# Patient Record
Sex: Female | Born: 1940 | Race: Black or African American | Hispanic: No | Marital: Single | Smoking: Current every day smoker
Health system: Southern US, Community
[De-identification: ages and names within clinical notes are randomized; demographics above are authoritative.]

## PROBLEM LIST (undated history)

## (undated) ENCOUNTER — Emergency Department (HOSPITAL_COMMUNITY): Admission: EM | Discharge status: Against Medical Advice | Payer: Medicare HMO

## (undated) DIAGNOSIS — I10 Essential (primary) hypertension: Secondary | ICD-10-CM

## (undated) DIAGNOSIS — N183 Chronic kidney disease, stage 3 unspecified: Secondary | ICD-10-CM

## (undated) DIAGNOSIS — M5430 Sciatica, unspecified side: Secondary | ICD-10-CM

## (undated) DIAGNOSIS — I2510 Atherosclerotic heart disease of native coronary artery without angina pectoris: Secondary | ICD-10-CM

## (undated) HISTORY — PX: ABDOMINAL SURGERY: SHX537

## (undated) HISTORY — PX: CORONARY ANGIOPLASTY WITH STENT PLACEMENT: SHX49

## (undated) HISTORY — PX: TONSILLECTOMY AND ADENOIDECTOMY: SHX28

---

## 2005-03-20 ENCOUNTER — Encounter: Admission: RE | Admit: 2005-03-20 | Discharge: 2005-03-20 | Payer: Self-pay

## 2006-07-11 ENCOUNTER — Emergency Department (HOSPITAL_COMMUNITY): Admission: EM | Admit: 2006-07-11 | Discharge: 2006-07-12 | Payer: Self-pay | Admitting: Emergency Medicine

## 2009-05-03 ENCOUNTER — Inpatient Hospital Stay (HOSPITAL_COMMUNITY): Admission: EM | Admit: 2009-05-03 | Discharge: 2009-05-06 | Payer: Self-pay | Admitting: Emergency Medicine

## 2009-05-04 ENCOUNTER — Encounter (INDEPENDENT_AMBULATORY_CARE_PROVIDER_SITE_OTHER): Payer: Self-pay | Admitting: Cardiovascular Disease

## 2009-05-04 ENCOUNTER — Ambulatory Visit: Payer: Self-pay | Admitting: Surgery

## 2009-05-19 ENCOUNTER — Encounter (HOSPITAL_COMMUNITY): Admission: RE | Admit: 2009-05-19 | Discharge: 2009-08-17 | Payer: Self-pay | Admitting: Cardiovascular Disease

## 2009-05-25 ENCOUNTER — Ambulatory Visit (HOSPITAL_COMMUNITY): Admission: RE | Admit: 2009-05-25 | Discharge: 2009-05-25 | Payer: Self-pay | Admitting: Cardiology

## 2009-08-18 ENCOUNTER — Encounter (HOSPITAL_COMMUNITY): Admission: RE | Admit: 2009-08-18 | Discharge: 2009-10-26 | Payer: Self-pay | Admitting: Cardiovascular Disease

## 2009-11-14 ENCOUNTER — Encounter: Admission: RE | Admit: 2009-11-14 | Discharge: 2010-01-04 | Payer: Self-pay | Admitting: *Deleted

## 2011-02-01 LAB — GLUCOSE, CAPILLARY: Glucose-Capillary: 94 mg/dL (ref 70–99)

## 2011-02-04 LAB — URINE MICROSCOPIC-ADD ON

## 2011-02-04 LAB — DIFFERENTIAL
Basophils Absolute: 0 10*3/uL (ref 0.0–0.1)
Basophils Relative: 0 % (ref 0–1)
Monocytes Relative: 2 % — ABNORMAL LOW (ref 3–12)
Neutro Abs: 12.6 10*3/uL — ABNORMAL HIGH (ref 1.7–7.7)
Neutrophils Relative %: 84 % — ABNORMAL HIGH (ref 43–77)

## 2011-02-04 LAB — CBC
HCT: 36.4 % (ref 36.0–46.0)
HCT: 39.5 % (ref 36.0–46.0)
Hemoglobin: 13.5 g/dL (ref 12.0–15.0)
Hemoglobin: 13.8 g/dL (ref 12.0–15.0)
MCV: 86.4 fL (ref 78.0–100.0)
MCV: 86.8 fL (ref 78.0–100.0)
MCV: 87.5 fL (ref 78.0–100.0)
Platelets: 162 10*3/uL (ref 150–400)
Platelets: 190 10*3/uL (ref 150–400)
RBC: 4.21 MIL/uL (ref 3.87–5.11)
RBC: 4.67 MIL/uL (ref 3.87–5.11)
RBC: 5.18 MIL/uL — ABNORMAL HIGH (ref 3.87–5.11)
RDW: 14.5 % (ref 11.5–15.5)
RDW: 15 % (ref 11.5–15.5)
WBC: 15 10*3/uL — ABNORMAL HIGH (ref 4.0–10.5)
WBC: 9.4 10*3/uL (ref 4.0–10.5)

## 2011-02-04 LAB — BASIC METABOLIC PANEL
BUN: 18 mg/dL (ref 6–23)
BUN: 22 mg/dL (ref 6–23)
Calcium: 9.3 mg/dL (ref 8.4–10.5)
Chloride: 104 mEq/L (ref 96–112)
Chloride: 105 mEq/L (ref 96–112)
Creatinine, Ser: 1.35 mg/dL — ABNORMAL HIGH (ref 0.4–1.2)
Creatinine, Ser: 1.37 mg/dL — ABNORMAL HIGH (ref 0.4–1.2)
Creatinine, Ser: 1.51 mg/dL — ABNORMAL HIGH (ref 0.4–1.2)
GFR calc Af Amer: 47 mL/min — ABNORMAL LOW (ref >60)
GFR calc Af Amer: 47 mL/min — ABNORMAL LOW (ref >60)
GFR calc non Af Amer: 34 mL/min — ABNORMAL LOW (ref >60)
GFR calc non Af Amer: 38 mL/min — ABNORMAL LOW (ref >60)
Glucose, Bld: 85 mg/dL (ref 70–99)
Potassium: 3.4 mEq/L — ABNORMAL LOW (ref 3.5–5.1)
Potassium: 4.3 mEq/L (ref 3.5–5.1)

## 2011-02-04 LAB — CK TOTAL AND CKMB (NOT AT ARMC)
CK, MB: 2.1 ng/mL (ref 0.3–4.0)
Relative Index: INVALID (ref 0.0–2.5)
Total CK: 92 U/L (ref 7–177)

## 2011-02-04 LAB — COMPREHENSIVE METABOLIC PANEL
ALT: 13 U/L (ref 0–35)
AST: 24 U/L (ref 0–37)
Calcium: 9.5 mg/dL (ref 8.4–10.5)
GFR calc Af Amer: 49 mL/min — ABNORMAL LOW (ref >60)
Sodium: 139 mEq/L (ref 135–145)
Total Protein: 6.9 g/dL (ref 6.0–8.3)

## 2011-02-04 LAB — LIPID PANEL
Cholesterol: 179 mg/dL (ref 0–200)
Total CHOL/HDL Ratio: 2.1 RATIO
VLDL: 8 mg/dL (ref 0–40)

## 2011-02-04 LAB — URINE CULTURE

## 2011-02-04 LAB — URINALYSIS, ROUTINE W REFLEX MICROSCOPIC
Nitrite: NEGATIVE
Specific Gravity, Urine: 1.02 (ref 1.005–1.030)
pH: 6.5 (ref 5.0–8.0)

## 2011-02-04 LAB — POCT CARDIAC MARKERS
CKMB, poc: <1 ng/mL — ABNORMAL LOW (ref 1.0–8.0)
Myoglobin, poc: 95.9 ng/mL (ref 12–200)
Troponin i, poc: <0.05 ng/mL (ref 0.00–0.09)

## 2011-02-04 LAB — TROPONIN I
Troponin I: 0.02 ng/mL (ref 0.00–0.06)
Troponin I: 0.04 ng/mL (ref 0.00–0.06)

## 2011-02-04 LAB — HEPARIN LEVEL (UNFRACTIONATED)
Heparin Unfractionated: 0.41 IU/mL (ref 0.30–0.70)
Heparin Unfractionated: 1 IU/mL — ABNORMAL HIGH (ref 0.30–0.70)

## 2011-02-04 LAB — CARDIAC PANEL(CRET KIN+CKTOT+MB+TROPI): Troponin I: 0.02 ng/mL (ref 0.00–0.06)

## 2011-02-04 LAB — APTT: aPTT: 32 seconds (ref 24–37)

## 2011-02-04 LAB — PROTIME-INR: INR: 1 (ref 0.00–1.49)

## 2011-02-04 LAB — D-DIMER, QUANTITATIVE: D-Dimer, Quant: 5.65 ug/mL-FEU — ABNORMAL HIGH (ref 0.00–0.48)

## 2011-02-04 LAB — TSH: TSH: 1.175 u[IU]/mL (ref 0.350–4.500)

## 2011-03-13 NOTE — H&P (Signed)
[name], [place] NO.:  [id number]   MEDICAL RECORD NO.:  [id number]          PATIENT TYPE:  INP   LOCATION:  2507                         FACILITY:  [facility]   PHYSICIAN:  Nanetta Batty, M.D.   DATE OF BIRTH:  May 18, 1941   DATE OF ADMISSION:  05/03/2009  DATE OF DISCHARGE:  05/06/2009                              HISTORY & PHYSICAL   CHIEF COMPLAINTS:  Chest pain and syncope.   HISTORY OF PRESENT ILLNESS:  The patient is a 70 year old female who is  followed at the [place], who has a long history of recurrent syncope with  negative workup in the past.  She said she was evaluated in [place], [place]  in 2005.  There was a question of carotid hypersensitivity.  She has  never had a catheterization.  She has had negative stress test in the  past.  She has never had a tilt table test.  She had recurrent syncope  twice in March 2009 and again in December 2009 and again on Friday prior  to this admission.  All episodes occurred in public places, the last  episode was in the [employer].  She has no history of injury with any  of these episodes.  The patient says she suddenly gets nauseated and  slowly collapsed to the floor.  She denies any associated tachycardia or  palpitations.  She has now seen in the emergency room on May 03, 2009,  after a syncopal spell and then complaining of vague substernal chest  pain.  She describes a left-sided chest pain which she has had off and  on for the last couple of weeks.  Her symptoms are not exertional.  She  denies any associated shortness of breath or unusual dyspnea.  Her  initial EKG shows some T-wave inversion in V2, V3, and V6 and now that  has resolved.  There is a question of whether this initial EKG was  actually Ms. Mura's.   PAST MEDICAL HISTORY:  Remarkable for treated hypertension.  She has  gastroesophageal reflux and had surgery in 2000 for this.  She has a  skin disorder and is on chronic tetracycline.   CURRENT  MEDICATIONS:  HCTZ, amlodipine, aspirin, omeprazole, and  tetracycline, she is unsure of the doses.   ALLERGIES:  She has no known drug allergies.   SOCIAL HISTORY:  She is single.  She has a sone in [place] and has 2  grandchildren.  She smokes about a pack a day.   FAMILY HISTORY:  Remarkable that her mother has coronary artery disease.   REVIEW OF SYSTEMS:  Essentially unremarkable except for noted above.  She denies any palpitations or tachycardia.   PHYSICAL EXAMINATION:  VITAL SIGNS:  Blood pressure 148/87, pulse 70,  and respirations 12.  GENERAL:  She is a well-developed, well-nourished African American  female in no acute distress.  HEENT:  Normocephalic.  Extraocular movements are intact.  Sclerae are  nonicteric.  Lids and conjunctivae are within normal limits.  NECK:  Without JVD or bruit.  CHEST: Clear to auscultation and percussion.  CARDIAC:  Regular  rate and rhythm without murmur, rub, or gallop.  Normal S1 and S2.  ABDOMEN:  Nontender.  No hepatosplenomegaly.  No bruits.  EXTREMITIES:  Without edema.  Distal pulses are diminished.  There are  no bruits.  NEURO:  Grossly intact.  She is awake, alert, oriented, and operative.  Moves all extremities without obvious deficit.   Chest x-ray does show a right lower lobe nodule.  Other labs are  pending.   IMPRESSION:  1. Syncope, recurrent.  2. Abnormal EKG.  3. Chest pain.  We will rule out myocardial infarction.  4. History of smoking.  5. Treated hypertension.  6. Gastroesophageal reflux.   PLAN:  The patient was seen by Dr. Allyson Sabal and myself in the emergency  room.  We will treat her as unstable angina and start her on heparin and  nitroglycerin and rule out for an MI.      Abelino Derrick, P.A.      Nanetta Batty, M.D.  Electronically Signed    LKK/MEDQ  D:  05/06/2009  T:  05/06/2009  Job:  [id number]

## 2011-03-13 NOTE — Cardiovascular Report (Signed)
NAMEMAURICIA, MERTENS NO.:  [id number]   MEDICAL RECORD NO.:  [id number]          PATIENT TYPE:  OIB   LOCATION:  2899                         FACILITY:  [facility]   PHYSICIAN:  Ritta Slot, MD     DATE OF BIRTH:  April 20, 1941   DATE OF PROCEDURE:  DATE OF DISCHARGE:  05/25/2009                            CARDIAC CATHETERIZATION   This is a tilt table study.   INDICATIONS:  [name] is a very pleasant 70 year old African American  female who recently underwent stenting to mid-RCA by Dr. Julieanne Manson  with a 3.5 x 20 mm bare-metal stent.  she has continued to have syncopal  episodes and is brought here for tilt table study to further elucidate  the cause of syncope.   After informed consent, the patient was placed in the supine position in  the second floor of [facility].  Her  baseline blood pressure was 142/82 with baseline heart rate of 60 sinus  rhythm.  She was tilted to 70 degrees per 45-minute protocol.  Within 2  minutes her blood pressure dropped to 91/67 with the heart rate of 81  sinus rhythm.  After 10 minutes of tilt to 70 degrees, her blood  pressure was 75/55 with the heart rate of 80 in sinus rhythm.  The  patient was symptomatic and her symptoms of presyncope were totally  reproduced to her previous syncopal episodes.  She was therefore brought  to the supine position.  She was given a liter bolus of normal saline.  Her blood pressure returned to 140/80 and her heart rate 60 within 2  minutes being in a supine position.   IMPRESSION:  Positive tilt-table study with vasodepressive syncope.  There is no evidence of cardioinhibitory syncope.   PLAN:  She need to have liberalized salt intake and avoid any  hypotensive medications.  She will follow up with Dr. Clarene Duke in 2 weeks'  time.      Ritta Slot, MD  Electronically Signed     Ritta Slot, MD  Electronically Signed    HS/MEDQ  D:   05/25/2009  T:  05/25/2009  Job:  [id number]

## 2011-03-13 NOTE — Discharge Summary (Signed)
[name], [name] NO.:  [id number]   MEDICAL RECORD NO.:  [id number]          PATIENT TYPE:  INP   LOCATION:  2507                         FACILITY:  [facility]   PHYSICIAN:  [name], M.D.   DATE OF BIRTH:  Aug 27, 1941   DATE OF ADMISSION:  05/03/2009  DATE OF DISCHARGE:  05/06/2009                               DISCHARGE SUMMARY   DISCHARGE DIAGNOSES:  1. Unstable angina, elective right coronary artery percutaneous      coronary intervention and bare-metal stenting this admission.  2. Good left ventricular function.  3. Recurrent syncope of undetermined etiology.  4. Labile hypertension.  5. Smoking, chronic obstructive pulmonary disease.  6. History of right lower lobe nodule by CT scan, this will require      followup in 6 months.  7. History of gastroesophageal reflux.   HOSPITAL COURSE:  The patient is a 70 year old female, who was followed  at the [place].  She has a long history of syncope dating back to 2005.  She  was worked up at the [place] in [place], she was not cathed and did not have  a tilt table.  She has had negative stress test.  She has had recurrent  syncope in the last year and half.  She was admitted on May 03, 2009,  after a syncopal spell, this one was associated with some chest pain.  She had an abnormal EKG on admission, although it is not clear this was  actually was [name]'s EKG, she had T wave inversion that was fairly  marked in V2, V3, and V5, and later this completely resolved.  Her  troponins were negative.  She has had some slight recurrent chest pain  in the hospital, which responded to nitroglycerin, so we proceeded with  diagnostic catheterization.  This was done on May 05, 2009.  She had a  hazy 80-85% RCA stenosis and less than 20% LAD narrowing.  LV function  was normal with an EF of 65% with LVH.  She underwent intervention to  the RCA by Dr. Clarene Duke with an elective bare-metal stent.  She tolerated  the procedure well.   We feel she can be discharged on May 06, 2009.  We  initially considered putting a loop recorder prior to discharge, but Dr.  Alanda Amass felt she should have a tilt table first.  Her LV gram did show  hyperdynamic function with LVH.  During her hospitalization, she was put  on beta-blocker.  We cut the dose back at discharge because her heart  rate has dropped into the low 50s.  We also cut her diuretic dose back.  Echocardiogram showed mild-to-moderate LVH with an EF of 60-65%.  There  was mild AR and mild MR.  CT scan done on admission because of a  positive D-dimer, showed no pulmonary emboli, but she does have a 6-mm  nodule in the right middle lobe.  It is suggested that the patient is at  somewhat higher risk for lung cancer, and a CT is recommended in 6  months.  Telemetry showed sinus rhythm without arrhythmia during her  entire hospitalization.  Lower extremity venous Dopplers were negative  for DVT.   OTHER LABORATORIES AT DISCHARGE:  White count is 9.4, hemoglobin 12.3,  hematocrit 36.4, and platelets 162.  Sodium 136, potassium 4.3, BUN 18,  and creatinine 1.3.  CK-MB and troponins were negative.  Lipid panel  shows cholesterol of 142, HDL 67, and LDL 67.  TSH within normal limits  at 1.17.  Initial urinalysis did show some leukocyte in her urine.  Urine culture showed insignificant growth.  D-dimer on admission was  5.65, as noted her CT and lower extremity venous Dopplers were negative.   DISPOSITION:  The patient is discharged in stable condition and will  follow up with Dr. Allyson Sabal.  She will need a tilt table as an outpatient  and possibly a loop recorder if her tilt table was negative.   DISCHARGE MEDICATIONS:  1. Metoprolol 12.5 mg b.i.d.  2. Tetracycline 500 mg a day.  3. Aspirin 81 mg a day.  4. HCTZ 12.5 mg a day.  5. Amlodipine 5 mg a day.  6. Plavix 75 mg a day for at least 60 days.  7. Nitroglycerin 0.4 mg sublingual p.r.n. chest pain.  8. Simvastatin 20 mg a  day.  9. Pepcid AC 20 mg a day.   She has been instructed to stop her omeprazole.  She has been instructed  not to drive for now.      [name], P.A.      [name], M.D.  Electronically Signed    LKK/MEDQ  D:  05/06/2009  T:  05/06/2009  Job:  [id number]

## 2011-03-13 NOTE — Cardiovascular Report (Signed)
[name], [name] NO.:  [id number]   MEDICAL RECORD NO.:  [id number]          PATIENT TYPE:  INP   LOCATION:  2507                         FACILITY:  [facility]   PHYSICIAN:  Richard A. Alanda Amass, M.D.DATE OF BIRTH:  22-Jul-1941   DATE OF PROCEDURE:  DATE OF DISCHARGE:                            CARDIAC CATHETERIZATION   PROCEDURE:  Retrograde central aortic catheterization, selective  coronary angiography pre and post IC nitroglycerin administration, left  ventricular angiogram,  right anterior oblique projection, left  ventricular angiogram hand injection, left anterior oblique projection,  abdominal angiogram hand injection, midstream posteroanterior  projection.   PROCEDURE:  The patient was brought to the second floor CP Lab in a  postabsorptive state 5 mg of Valium p.o. premedication.  She was  hydrated preoperatively with normal saline, premedicated with 5 mg of  Valium.  She was given 2 mg of Versed and 25 mcg of fentanyl IV for  sedation in laboratory.  The right groin was prepped and draped in usual  manner.  Xylocaine 1% was used for local anesthesia.  The RCFA was  entered with single anterior puncture using an 18 thin-wall needle and a  5-French short side-arm sheaths were inserted without difficulty.  Diagnostic coronary angiography was done with Cordis 5-French 4-cm taper  without coronary catheters.  A 200 mcg IC nitroglycerin was given to the  left coronary system with repeat injections obtained.  Likewise 200 mcg  of IC nitroglycerin was given to the right coronary with repeat  injections obtained.  Limited dye was used for hand injections.  LV  angiogram was done at 22 mL, 14 mL per second through a pigtail catheter  and RAO projection by hand injection and the LAO projection.  Catheter  was pullback pressure and CA showed no gradient across the aortic valve.  Catheter was positioned above the level of the renal arteries and hand  injection was  performed.  A double left renal collecting system was  noted and a single right renal collecting system.  Both renal arteries  had some minor irregularities, but were single and no significant  stenosis bilaterally.  Catheters removed.  Side-arm sheath was flushed.  Pending review of the patient's cineangiograms.  Omnipaque dye was used  throughout the procedure.  The patient tolerated the procedure well with  no chest pain or arrhythmia and specifically no bradycardia maintaining  sinus rhythm at 65 per minute.   PRESSURES:  LV:  185/0; LVEDP 18 mmHg.   CA:  185/75 mmHg.   There was no gradient across the aortic valve on catheter pullback.   Fluoroscopy revealed minimal plus one-half calcification of the proximal  LAD.  No other significant coronary and no intracardiac or valvular  calcification visualized.   The LV angiogram in the RAO projection showed hyperdynamic LV with EF  greater than 60% proximally 65% with mid ventricular cavity  constriction, but no obstruction.  This may be related to the fact that  the patient was on IV nitroglycerin in the laboratory.  There was  minimal mitral regurgitation, but no mitral regurgitation was noted.   The  main left coronary is normal.   The left anterior descending.  The artery is widely patent across the  apex of the heart where it bifurcated.  There was a bend and loop in the  mid LAD and possibly 20% smooth narrowing in this area.  There was no  systolic compression visualized, and there was normal TIMI III flow to  the distal LAD.   There are 2 proximal septal perforators followed by normal DX-1 in the  junction of the proximal third and that was small and a normal moderate  to large DX-2 distal just distal to this and bifurcated.   The circumflex was nondominant and comprised of a large marginal branch  that bifurcated in the PIVG branch that bifurcated and was normal.   The right coronary is a large dominant vessel.  The  entire artery was  widely patent and smooth and large except for a mildly segmental area in  the mid portion which was hazy with decreased dye density.  Best seen in  the RAO projection.  There was an 80% to 85% eccentric stenosis.  There  was no visible thrombus.  There was good TIMI III flow beyond this.  The  remainder of the RCA had no significant stenosis with a normal large PLA  and a normal PDA branch.  Two RV branches proximal to the lesion were  normal.  There was a large shepherds crook bend to the proximal third of  the RCA.   DISCUSSION:  This 70 year old African American single mother of 1 son  (lives in [place]) with 2 grandchildren, smokes pack a day, currently  unemployed.  She has long history of syncope with multiple syncopal  episodes several years ago and 3 recent syncopal episodes March 2009,  and December 2009 and on April 29, 2009.  The last 2 were witnessed in  public places.  There is an aura and historically they sound like  vasovagal syncope.  She has a history of hypertension, has been on baby  aspirin a day, HCTZ and PPI omeprazole.  She has had 2 weeks of  intermittent, dull, nonexertional left precordial chest pain.  This  appeared to be relieved by IV nitroglycerin in hospital.  Myocardial  infarction was ruled out by serial enzymes and EKGs.  D-dimer was  elevated, but CT of the chest was normal with no pulmonary emboli.  She  did have right middle lobe 6-mm pulmonary nodule.  Since she is a  smoker, will probably need to be followed up in 6 months according to  Radiology.  She underwent catheterization for evaluation of coronary  disease with transient T-wave changes in V1 and V2 along with her  history as outlined above.   The patient has single vessel disease that is a high grade 80% to 85%  eccentric slightly hazy lesion in the mid RCA.  No visible thrombus with  normal flow.  She has a bend in the mid LAD, but no systolic  compression.  There was  systemic hypertension and normal renal arteries.  It is possible that she could be having bradycardia related to RCA  ischemia; however, history of syncope dates back many years and sounds  predominantly vasovagal.  She has never had a tilt table test.  There  have been no arrhythmias noted while she has been in the hospital here.   She is a candidate for PCI of right coronary artery and should do well  hopefully with single stent and  associated medical therapy.  She will be  continued to be hydrated throughout her procedure and postprocedure.  I  would recommend she have a followup tilt table test and recommend she  have repeat CT of the chest in 6 months.  She will need to be careful  with hypotensive agents and she despite high HDLs in high 50s and LDL of  100.  She is a candidate for statin therapy and probably ARB therapy as  well.  Her creatinine is 1.5.  She will be continued to be high graded.  There is no history of diabetes and renal function would be followed up.   CATHETERIZATION DIAGNOSES:  1. Chest pain 2 weeks duration, myocardial infarction ruled out as      outlined above, transient anterior T wave changes.  2. Nonspecific 2 high grade mid right coronary artery stenosis, single      vessel lesion, eccentric hazy.  3. Recurrent syncopal episodes long history and recently proximal      right coronary artery ischemia and no arrhythmia in hospital.  We      will need further workup, which should initially probably start      with tilt table testing.  4. Systemic hypertension, normal renal arteries (double left renal      collecting system noted).  5. Left ventricular hypertrophy hyperdynamic LV.  6. Chronic cigarette abuse one pack a day.      Richard A. Alanda Amass, M.D.  Electronically Signed     RAW/MEDQ  D:  05/05/2009  T:  05/06/2009  Job:  [id number]   cc:   Nanetta Batty, M.D.  [facility] [place] [facility], M.D.  CP Lab

## 2011-03-13 NOTE — Cardiovascular Report (Signed)
NAMEELEANER, [name] NO.:  [id number]   MEDICAL RECORD NO.:  [id number]          PATIENT TYPE:  INP   LOCATION:  2507                         FACILITY:  [facility]   PHYSICIAN:  Thereasa Solo. Little, M.D. DATE OF BIRTH:  1941-01-14   DATE OF PROCEDURE:  05/05/2009  DATE OF DISCHARGE:                            CARDIAC CATHETERIZATION   This 70 year old female was admitted with syncopal episodes.  She  underwent a diagnostic catheterization by Dr. Alanda Amass on May 05, 2009  that revealed a 70-80% eccentric mid RCA lesion and a 3.5-mm vessel that  was quite dominant.  I was called to intervene.   After discussing this with the patient, the patient was given IV  Angiomax.  Her ACT was 390 prior to proceeding with the intervention.  She had a 5-French sheath in her right femoral artery upgraded to a 6-  [place] system.  A JR-3.5 guide and a short Luge wire was used.  The wire  was placed well down the ongoing right coronary artery passed through  the posterior descending branch.  Initially, primary stenting was  undertaken with a 3.5 x 20 VeriFlex bare-metal stent.  It was deployed  in such a manner that both the proximal and distal limbs were well  covered.  It was deployed at 14 atmospheres for 45 seconds.   Postdilatation was then performed using a 3.75 x 12 Martinez Sprinter 14  atmospheres for 35 seconds.   The area that had been 70-80% eccentrically narrowed in the mid RCA, now  appeared to be normal post primary stenting.  There was brisk TIMI III  flow.  There was no evidence of any dissection or thrombus.  The  Angiomax was turned off.  She was given a loading dose of Plavix in the  cath lab and will need to be on Plavix 75 mg with aspirin for minimum 60  days.           ______________________________  Thereasa Solo Little, M.D.     ABL/MEDQ  D:  05/05/2009  T:  05/06/2009  Job:  [id number]   cc:   Gerlene Burdock A. Alanda Amass, M.D.  Cath Lab  [place]

## 2012-05-08 ENCOUNTER — Encounter (HOSPITAL_COMMUNITY): Payer: Self-pay | Admitting: Neurology

## 2012-05-08 ENCOUNTER — Emergency Department (HOSPITAL_COMMUNITY): Payer: Medicare Other

## 2012-05-08 ENCOUNTER — Emergency Department (HOSPITAL_COMMUNITY)
Admission: EM | Admit: 2012-05-08 | Discharge: 2012-05-08 | Disposition: A | Discharge status: Home / Self Care | Payer: Medicare Other | Attending: Emergency Medicine | Admitting: Emergency Medicine

## 2012-05-08 DIAGNOSIS — Z79899 Other long term (current) drug therapy: Secondary | ICD-10-CM | POA: Insufficient documentation

## 2012-05-08 DIAGNOSIS — S0003XA Contusion of scalp, initial encounter: Secondary | ICD-10-CM | POA: Insufficient documentation

## 2012-05-08 DIAGNOSIS — I10 Essential (primary) hypertension: Secondary | ICD-10-CM | POA: Insufficient documentation

## 2012-05-08 DIAGNOSIS — R55 Syncope and collapse: Secondary | ICD-10-CM | POA: Insufficient documentation

## 2012-05-08 DIAGNOSIS — R197 Diarrhea, unspecified: Secondary | ICD-10-CM | POA: Insufficient documentation

## 2012-05-08 DIAGNOSIS — R296 Repeated falls: Secondary | ICD-10-CM | POA: Insufficient documentation

## 2012-05-08 DIAGNOSIS — E860 Dehydration: Secondary | ICD-10-CM | POA: Insufficient documentation

## 2012-05-08 DIAGNOSIS — R1110 Vomiting, unspecified: Secondary | ICD-10-CM | POA: Insufficient documentation

## 2012-05-08 DIAGNOSIS — Z7982 Long term (current) use of aspirin: Secondary | ICD-10-CM | POA: Insufficient documentation

## 2012-05-08 HISTORY — DX: Essential (primary) hypertension: I10

## 2012-05-08 LAB — DIFFERENTIAL
Basophils Relative: 0 % (ref 0–1)
Eosinophils Absolute: 0.2 10*3/uL (ref 0.0–0.7)
Eosinophils Relative: 1 % (ref 0–5)
Lymphs Abs: 1.8 10*3/uL (ref 0.7–4.0)
Monocytes Relative: 7 % (ref 3–12)

## 2012-05-08 LAB — URINE MICROSCOPIC-ADD ON

## 2012-05-08 LAB — CBC
Hemoglobin: 13.7 g/dL (ref 12.0–15.0)
MCH: 28.2 pg (ref 26.0–34.0)
MCHC: 33.7 g/dL (ref 30.0–36.0)
MCV: 83.9 fL (ref 78.0–100.0)
RBC: 4.85 MIL/uL (ref 3.87–5.11)

## 2012-05-08 LAB — URINALYSIS, ROUTINE W REFLEX MICROSCOPIC
Nitrite: NEGATIVE
Protein, ur: 30 mg/dL — AB
Urobilinogen, UA: 0.2 mg/dL (ref 0.0–1.0)

## 2012-05-08 LAB — COMPREHENSIVE METABOLIC PANEL
BUN: 33 mg/dL — ABNORMAL HIGH (ref 6–23)
Calcium: 8.6 mg/dL (ref 8.4–10.5)
Creatinine, Ser: 1.54 mg/dL — ABNORMAL HIGH (ref 0.50–1.10)
GFR calc Af Amer: 38 mL/min — ABNORMAL LOW (ref >90)
Glucose, Bld: 91 mg/dL (ref 70–99)
Total Protein: 6.6 g/dL (ref 6.0–8.3)

## 2012-05-08 LAB — PROTIME-INR
INR: 1.06 (ref 0.00–1.49)
Prothrombin Time: 14 seconds (ref 11.6–15.2)

## 2012-05-08 LAB — POCT I-STAT TROPONIN I: Troponin i, poc: 0.02 ng/mL (ref 0.00–0.08)

## 2012-05-08 MED ORDER — METOCLOPRAMIDE HCL 5 MG/ML IJ SOLN
10.0000 mg | Freq: Once | INTRAMUSCULAR | Status: AC
  Administered 2012-05-08: 10 mg via INTRAVENOUS
  Filled 2012-05-08: qty 2

## 2012-05-08 MED ORDER — SODIUM CHLORIDE 0.9 % IV SOLN
1000.0000 mL | Freq: Once | INTRAVENOUS | Status: AC
  Administered 2012-05-08: 1000 mL via INTRAVENOUS

## 2012-05-08 MED ORDER — SODIUM CHLORIDE 0.9 % IV SOLN: 1000.0000 mL | INTRAVENOUS | Status: DC

## 2012-05-08 MED ORDER — SODIUM CHLORIDE 0.9 % IV SOLN: 1000.0000 mL | Freq: Once | INTRAVENOUS | Status: DC

## 2012-05-08 MED ORDER — DIPHENHYDRAMINE HCL 50 MG/ML IJ SOLN
25.0000 mg | Freq: Once | INTRAMUSCULAR | Status: AC
  Administered 2012-05-08: 25 mg via INTRAVENOUS
  Filled 2012-05-08: qty 1

## 2012-05-08 MED ORDER — ONDANSETRON HCL 4 MG PO TABS: 4.0000 mg | ORAL_TABLET | Freq: Three times a day (TID) | ORAL | Status: AC | PRN

## 2012-05-08 NOTE — ED Provider Notes (Signed)
History     CSN: [id number]  Arrival date & time 05/08/12  1657   First MD Initiated Contact with Patient 05/08/12 1722      Chief Complaint  Patient presents with  . Near Syncope    (Consider location/radiation/quality/duration/timing/severity/associated sxs/prior treatment) Patient is a 71 y.o. female presenting with syncope. The history is provided by the patient and a friend.  Loss of Consciousness This is a new problem. The current episode started today. Pertinent negatives include no abdominal pain, chest pain, chills or neck pain.   71 year old female in no acute distress complaining of syncope earlier in the afternoon. Patient has past medical history significant for high blood pressure, hyperlipdemia and a recent exacerbation of diverticulitis with several episodes of watery diarrhea and nonbloody nonbilious vomiting since Tuesday. Patient was sitting on her porch and lost consciousness episode was unwitnessed no aura preceded the event. This happened roughly at 4 PM. Patient had a similar episode approximately 4 years ago she had a heart attack with placement of a cardiac stent. Patient denies chest pain, nausea vomiting, palpitations, shortness of breath, abdominal pain. She is sore to  The  left temple. Denies focal weakness, difficulty speaking or change in vision.   Past Medical History  Diagnosis Date  . Hypertension     Past Surgical History  Procedure Date  . Coronary angioplasty with stent placement     History reviewed. No pertinent family history.  History  Substance Use Topics  . Smoking status: Not on file  . Smokeless tobacco: Not on file  . Alcohol Use:     OB History    Grav Para Term Preterm Abortions TAB SAB Ect Mult Living                  Review of Systems  Constitutional: Negative for chills.  HENT: Negative for neck pain.   Cardiovascular: Positive for syncope. Negative for chest pain.  Gastrointestinal: Negative for abdominal pain.     Allergies  Review of patient's allergies indicates no known allergies.  Home Medications   Current Outpatient Rx  Name Route Sig Dispense Refill  . VITAMIN C PO Oral Take 1 tablet by mouth 2 (two) times daily.    . ASPIRIN 81 MG PO TABS Oral Take 81 mg by mouth 2 (two) times daily.    Marland Kitchen VITAMIN D-3 PO Oral Take 1 tablet by mouth daily.    Marland Kitchen HYDROCHLOROTHIAZIDE 50 MG PO TABS Oral Take 50 mg by mouth daily.    Marland Kitchen LOSARTAN POTASSIUM 50 MG PO TABS Oral Take 50 mg by mouth daily.    Marland Kitchen NITROGLYCERIN 0.4 MG SL SUBL Sublingual Place 0.4 mg under the tongue every 5 (five) minutes as needed. For chest pain    . OMEPRAZOLE 20 MG PO CPDR Oral Take 20 mg by mouth 2 (two) times daily.    Marland Kitchen SIMVASTATIN 20 MG PO TABS Oral Take 20 mg by mouth at bedtime.      BP 100/70  Resp 17  SpO2 93%  Physical Exam  Nursing note and vitals reviewed. Constitutional: She is oriented to person, place, and time. She appears well-developed and well-nourished. No distress.  HENT:  Head: Normocephalic.  Right Ear: External ear normal.  Left Ear: External ear normal.       Ecchymosis to left temple. No crepitance. EOMI with no pain with movement. Dry mucous membranes  Eyes: Conjunctivae and EOM are normal. Pupils are equal, round, and reactive to light.  Palpebral conjunctiva ruddy  Neck: Normal range of motion. Neck supple.       No midline tenderness.   Cardiovascular: Normal rate, regular rhythm and normal heart sounds.   Pulmonary/Chest: Effort normal.  Musculoskeletal: Normal range of motion.  Neurological: She is alert and oriented to person, place, and time.  Psychiatric: She has a normal mood and affect.    ED Course  Procedures (including critical care time)  Labs Reviewed  CBC - Abnormal; Notable for the following:    WBC 17.2 (*)     All other components within normal limits  DIFFERENTIAL - Abnormal; Notable for the following:    Neutrophils Relative 81 (*)     Neutro Abs 14.0 (*)      Lymphocytes Relative 11 (*)     Monocytes Absolute 1.2 (*)     All other components within normal limits  PROTIME-INR  POCT I-STAT TROPONIN I  POCT CBG (FASTING - GLUCOSE)-MANUAL ENTRY  COMPREHENSIVE METABOLIC PANEL  URINALYSIS, ROUTINE W REFLEX MICROSCOPIC   Dg Chest 2 View  05/08/2012  *RADIOLOGY REPORT*  Clinical Data: Syncope  CHEST - 2 VIEW  Comparison: 05/03/2009  Findings: The heart is upper normal in size.  Chronic changes at the left lung base.  Upper lungs clear.  Hyperaeration.  Bronchitic changes.  No pneumothorax.  IMPRESSION: Chronic changes at the left base.  Changes related to COPD.  Original Report Authenticated By: Donavan Burnet, M.D.   Ct Head Wo Contrast  05/08/2012  *RADIOLOGY REPORT*  Clinical Data:  Near syncope.  CT HEAD WITHOUT CONTRAST CT CERVICAL SPINE WITHOUT CONTRAST  Technique:  Multidetector CT imaging of the head and cervical spine was performed following the standard protocol without intravenous contrast.  Multiplanar CT image reconstructions of the cervical spine were also generated.  Comparison:   None  CT HEAD  Findings: There is diffuse low density throughout the subcortical and periventricular white matter.  Evidence for a cavum septum pellucidum.  No evidence for acute hemorrhage, mass lesion, midline shift,  hydrocephalus or large infarct.  There may be an old lacunar injury in the left caudate head.  The visualized paranasal sinuses are clear.  No evidence for acute fracture.  IMPRESSION: No acute intracranial abnormalities.  Diffuse white matter disease bilaterally.  Findings are nonspecific but could represent chronic small vessel ischemic changes.  CT CERVICAL SPINE  Findings: There is motion artifact on the cervical spine images. No evidence for a fracture or dislocation.  Degenerative facet changes along the right side of the cervical spine.  There are no gross soft tissue abnormalities surrounding the cervical spine. There is a large calcification  involving the right thyroid lobe and 1.5 cm nodule involving the inferior left thyroid lobe.  There are emphysematous changes in the lung apices.  No evidence for a pneumothorax.  There is a mild anterolisthesis at C3-C4.  Normal alignment at the cervicothoracic junction.  Disc/osteophyte complex at C6-7.  IMPRESSION: The examination is slightly limited due to motion artifact but no gross fracture or dislocation.  Cervical spondylosis.  1.5 cm nodule involving the inferior left thyroid lobe.  Recommend further characterization with a non emergent ultrasound.  Original Report Authenticated By: Richarda Overlie, M.D.   Ct Cervical Spine Wo Contrast  05/08/2012  *RADIOLOGY REPORT*  Clinical Data:  Near syncope.  CT HEAD WITHOUT CONTRAST CT CERVICAL SPINE WITHOUT CONTRAST  Technique:  Multidetector CT imaging of the head and cervical spine was performed following the standard protocol without intravenous  contrast.  Multiplanar CT image reconstructions of the cervical spine were also generated.  Comparison:   None  CT HEAD  Findings: There is diffuse low density throughout the subcortical and periventricular white matter.  Evidence for a cavum septum pellucidum.  No evidence for acute hemorrhage, mass lesion, midline shift,  hydrocephalus or large infarct.  There may be an old lacunar injury in the left caudate head.  The visualized paranasal sinuses are clear.  No evidence for acute fracture.  IMPRESSION: No acute intracranial abnormalities.  Diffuse white matter disease bilaterally.  Findings are nonspecific but could represent chronic small vessel ischemic changes.  CT CERVICAL SPINE  Findings: There is motion artifact on the cervical spine images. No evidence for a fracture or dislocation.  Degenerative facet changes along the right side of the cervical spine.  There are no gross soft tissue abnormalities surrounding the cervical spine. There is a large calcification involving the right thyroid lobe and 1.5 cm nodule  involving the inferior left thyroid lobe.  There are emphysematous changes in the lung apices.  No evidence for a pneumothorax.  There is a mild anterolisthesis at C3-C4.  Normal alignment at the cervicothoracic junction.  Disc/osteophyte complex at C6-7.  IMPRESSION: The examination is slightly limited due to motion artifact but no gross fracture or dislocation.  Cervical spondylosis.  1.5 cm nodule involving the inferior left thyroid lobe.  Recommend further characterization with a non emergent ultrasound.  Original Report Authenticated By: Richarda Overlie, M.D.     Date: 05/08/2012  Rate: 72   Rhythm: normal sinus rhythm  QRS Axis: normal  Intervals: normal  ST/T Wave abnormalities: normal  Conduction Disutrbances:none  Narrative Interpretation:   Old EKG Reviewed: unchanged   1. Dehydration   2. Syncope       MDM  Patient seen and cleared from cervical collar by nexus criteria. EKG shows no STEMI and troponin 0.02 will repeat. Suspect dehydration, orthostatics pending, but BP is fluid responsive and increasedto 150s over 70s following bolus. Shared visit with attending Dr. Preston Fleeting who agrees with plan to and safety to d/c to home.  Pt verbalized understanding and agrees with care plan. Outpatient follow-up and return precautions given.         Wynetta Emery, PA-C 05/12/12 1519

## 2012-05-08 NOTE — ED Notes (Signed)
Pt has been vomiting x several days.  Was weak today, had a possible syncopal episode.  Pt diaphoretic/clamy on EMS arrival.  Pt reports finger tingling, CMS intact.  No visible deformity.  Pt has bruising to (L) temple and forehead.  PERRLA.  Pt A/O x 4.  Vitals stable.  80NSR, 120/80.  20ga (L) wrist.

## 2012-05-08 NOTE — ED Provider Notes (Signed)
71 year old female has been vomiting for the last several days. She's been taking Phenergan with no relief. Today, she got lightheaded and clammy. She denies fever or diarrhea. In the emergency department, abdomen is noted to be soft and nontender. She does not have significant orthostatic changes. She feels better to getting some IV fluids. CT of head and neck showed a thyroid nodule which will need to be followed as an outpatient.  ECG shows normal sinus rhythm with a rate of 72, no ectopy. Normal axis. Normal P wave. Normal QRS. Normal intervals. Normal ST and T waves. Impression: normal ECG. When compared with ECG of 05/06/2009, no significant changes are seen.   Dione Booze, MD 05/08/12 2002

## 2012-05-08 NOTE — ED Notes (Signed)
Triage note put in by Florentina Addison, RN not Maralyn Sago.

## 2012-05-08 NOTE — ED Notes (Signed)
Discharge instructions given and patient voiced understanding 

## 2012-05-08 NOTE — ED Notes (Signed)
Patient requesting something to drink.

## 2012-05-12 NOTE — ED Provider Notes (Signed)
Medical screening examination/treatment/procedure(s) were conducted as a shared visit with non-physician practitioner(s) and myself.  I personally evaluated the patient during the encounter    Dione Booze, MD 05/12/12 1539

## 2012-05-30 ENCOUNTER — Emergency Department (HOSPITAL_COMMUNITY): Payer: Medicare Other

## 2012-05-30 ENCOUNTER — Emergency Department (HOSPITAL_COMMUNITY)
Admission: EM | Admit: 2012-05-30 | Discharge: 2012-05-30 | Disposition: A | Discharge status: Home / Self Care | Payer: Medicare Other | Attending: Emergency Medicine | Admitting: Emergency Medicine

## 2012-05-30 ENCOUNTER — Encounter (HOSPITAL_COMMUNITY): Payer: Self-pay | Admitting: Adult Health

## 2012-05-30 DIAGNOSIS — I10 Essential (primary) hypertension: Secondary | ICD-10-CM | POA: Insufficient documentation

## 2012-05-30 DIAGNOSIS — Z9861 Coronary angioplasty status: Secondary | ICD-10-CM | POA: Insufficient documentation

## 2012-05-30 DIAGNOSIS — R1011 Right upper quadrant pain: Secondary | ICD-10-CM | POA: Insufficient documentation

## 2012-05-30 DIAGNOSIS — Z9889 Other specified postprocedural states: Secondary | ICD-10-CM | POA: Insufficient documentation

## 2012-05-30 DIAGNOSIS — R109 Unspecified abdominal pain: Secondary | ICD-10-CM

## 2012-05-30 DIAGNOSIS — Z79899 Other long term (current) drug therapy: Secondary | ICD-10-CM | POA: Insufficient documentation

## 2012-05-30 LAB — CBC WITH DIFFERENTIAL/PLATELET
Basophils Absolute: 0 10*3/uL (ref 0.0–0.1)
Basophils Relative: 1 % (ref 0–1)
Eosinophils Absolute: 0.4 10*3/uL (ref 0.0–0.7)
Eosinophils Relative: 5 % (ref 0–5)
HCT: 38.6 % (ref 36.0–46.0)
MCHC: 33.4 g/dL (ref 30.0–36.0)
MCV: 84.5 fL (ref 78.0–100.0)
Monocytes Absolute: 0.7 10*3/uL (ref 0.1–1.0)
Neutro Abs: 4.2 10*3/uL (ref 1.7–7.7)
RDW: 15.9 % — ABNORMAL HIGH (ref 11.5–15.5)

## 2012-05-30 LAB — COMPREHENSIVE METABOLIC PANEL
ALT: 8 U/L (ref 0–35)
Alkaline Phosphatase: 63 U/L (ref 39–117)
GFR calc Af Amer: 51 mL/min — ABNORMAL LOW (ref >90)
Glucose, Bld: 81 mg/dL (ref 70–99)
Potassium: 3.4 mEq/L — ABNORMAL LOW (ref 3.5–5.1)
Sodium: 144 mEq/L (ref 135–145)
Total Protein: 6.3 g/dL (ref 6.0–8.3)

## 2012-05-30 LAB — URINE MICROSCOPIC-ADD ON

## 2012-05-30 LAB — URINALYSIS, ROUTINE W REFLEX MICROSCOPIC
Bilirubin Urine: NEGATIVE
Hgb urine dipstick: NEGATIVE
Protein, ur: NEGATIVE mg/dL
Urobilinogen, UA: 0.2 mg/dL (ref 0.0–1.0)

## 2012-05-30 MED ORDER — HYDROMORPHONE HCL PF 1 MG/ML IJ SOLN
0.5000 mg | Freq: Once | INTRAMUSCULAR | Status: AC
  Administered 2012-05-30: 0.5 mg via INTRAVENOUS
  Filled 2012-05-30: qty 1

## 2012-05-30 MED ORDER — ONDANSETRON HCL 4 MG/2ML IJ SOLN
4.0000 mg | Freq: Once | INTRAMUSCULAR | Status: AC
  Administered 2012-05-30: 4 mg via INTRAVENOUS
  Filled 2012-05-30: qty 2

## 2012-05-30 MED ORDER — SODIUM CHLORIDE 0.9 % IV BOLUS (SEPSIS)
1000.0000 mL | Freq: Once | INTRAVENOUS | Status: AC
  Administered 2012-05-30: 1000 mL via INTRAVENOUS

## 2012-05-30 NOTE — ED Notes (Signed)
Rx x 0, pt voiced understanding to f[facility] and [facility] today.

## 2012-05-30 NOTE — ED Notes (Signed)
Request and permission to call pt work and make them aware that she is being treated in the ED,.

## 2012-05-30 NOTE — ED Provider Notes (Signed)
History     CSN: [id number]  Arrival date & time 05/30/12  0104   First MD Initiated Contact with Patient 05/30/12 0115      Chief Complaint  Patient presents with  . Abdominal Pain   HPI The patient presents with abdominal pain.  Notably, the patient had a colonoscopy, routine, as performed this morning , approximately 12 hours prior to the development of abdominal pain.  Since onset she said pain persistently in her right upper quadrant, described as a full sensation.  She denies concurrent nausea, vomiting, anorexia, fevers, chills, diarrhea. She had minimal relief with Aleve.  Past Medical History  Diagnosis Date  . Hypertension     Past Surgical History  Procedure Date  . Coronary angioplasty with stent placement     History reviewed. No pertinent family history.  History  Substance Use Topics  . Smoking status: Not on file  . Smokeless tobacco: Not on file  . Alcohol Use:     OB History    Grav Para Term Preterm Abortions TAB SAB Ect Mult Living                  Review of Systems  Constitutional:       HPI  HENT:       HPI otherwise negative  Eyes: Negative.   Respiratory:       HPI, otherwise negative  Cardiovascular:       HPI, otherwise nmegative  Gastrointestinal: Negative for vomiting.  Genitourinary:       HPI, otherwise negative  Musculoskeletal:       HPI, otherwise negative  Skin: Negative.   Neurological: Negative for syncope.    Allergies  Paxil  Home Medications   Current Outpatient Rx  Name Route Sig Dispense Refill  . VITAMIN C PO Oral Take 1 tablet by mouth 2 (two) times daily.    . ASPIRIN 81 MG PO TABS Oral Take 81 mg by mouth 2 (two) times daily.    Marland Kitchen VITAMIN D-3 PO Oral Take 1 tablet by mouth daily.    Marland Kitchen HYDROCHLOROTHIAZIDE 50 MG PO TABS Oral Take 50 mg by mouth daily.    Marland Kitchen LOSARTAN POTASSIUM 50 MG PO TABS Oral Take 50 mg by mouth daily.    Marland Kitchen NITROGLYCERIN 0.4 MG SL SUBL Sublingual Place 0.4 mg under the tongue every 5  (five) minutes as needed. For chest pain    . OMEPRAZOLE 20 MG PO CPDR Oral Take 20 mg by mouth 2 (two) times daily.    Marland Kitchen SIMVASTATIN 20 MG PO TABS Oral Take 20 mg by mouth at bedtime.      BP 167/98  Pulse 74  Temp 99.1 F (37.3 C) (Oral)  Resp 16  SpO2 97%  Physical Exam  Nursing note and vitals reviewed. Constitutional: She is oriented to person, place, and time. She appears well-developed and well-nourished. No distress.  HENT:  Head: Normocephalic and atraumatic.  Eyes: Conjunctivae and EOM are normal.  Cardiovascular: Normal rate and regular rhythm.   Pulmonary/Chest: Effort normal and breath sounds normal. No stridor. No respiratory distress.  Abdominal: Soft. Bowel sounds are normal. She exhibits no distension. There is tenderness in the right upper quadrant. There is no tenderness at McBurney's point and negative Murphy's sign.  Musculoskeletal: She exhibits no edema.  Neurological: She is alert and oriented to person, place, and time. No cranial nerve deficit.  Skin: Skin is warm and dry.  Psychiatric: She has a normal mood and  affect.    ED Course  Procedures (including critical care time)  Labs Reviewed  COMPREHENSIVE METABOLIC PANEL - Abnormal; Notable for the following:    Potassium 3.4 (*)     Creatinine, Ser 1.21 (*)     Total Bilirubin 0.2 (*)     GFR calc non Af Amer 44 (*)     GFR calc Af Amer 51 (*)     All other components within normal limits  CBC WITH DIFFERENTIAL - Abnormal; Notable for the following:    RDW 15.9 (*)     All other components within normal limits  LIPASE, BLOOD  URINALYSIS, ROUTINE W REFLEX MICROSCOPIC   Dg Abd Acute W/chest  05/30/2012  *RADIOLOGY REPORT*  Clinical Data: Abdominal pain, recent colonoscopy.  ACUTE ABDOMEN SERIES (ABDOMEN 2 VIEW & CHEST 1 VIEW)  Comparison:  05/03/2009 chest CT, 05/08/2012 chest radiograph  Findings: Heart size upper normal to mildly enlarged. Mild interstitial prominence and left lung base scarring.   Aortic arch atherosclerosis.  No pleural effusion or pneumothorax.  Colonic and distal small bowel gaseous distension is nonspecific however likely related to recent colonoscopy.  No free intraperitoneal air identified.  No acute osseous finding. Sequelae of prior inferior rami fractures.  Organ outlines normal where seen.  IMPRESSION: Gaseous distension of colon is most in keeping with recent colonoscopy.  No free intraperitoneal air.  Original Report Authenticated By: Waneta Martins, M.D.     1. Abdominal pain       MDM  This elderly female presents the same day as a colonoscopy now with right upper quadrant pain.  On exam the patient was in no distress, though with her tenderness to the abdomen, there is concern for postprocedural perforation, though the absence of distress, abnormal vital signs his reassuring for the low probability of this entity.  The patient's x-ray was reassuring, her labs were also reassuring.  She was discharged with instructions to follow up with her physician and her gastroenterologist in the morning for this episode of pain likely secondary to a procedure.    Gerhard Munch, MD 05/30/12 [phone]

## 2012-05-30 NOTE — ED Notes (Signed)
Pt to xray

## 2012-05-30 NOTE — ED Notes (Signed)
Pt returned from xray

## 2012-05-30 NOTE — ED Notes (Addendum)
Reports upper right quadrant pain that began 4 hours ago after a colonoscopy this afternoon at the [facility] in [place]. Pain is described as a dull ache, pt denies SOB, denies nausea. Pain is constant and steady.  Pt took 2 aleve with no relief

## 2013-02-13 IMAGING — CR DG ABDOMEN ACUTE W/ 1V CHEST
3 series · 3 of 3 positions shown · non-contrast
Comparison: 05/03/2009 chest CT, 05/08/2012 chest radiograph

CLINICAL DATA: Abdominal pain, recent colonoscopy.

ACUTE ABDOMEN SERIES (ABDOMEN 2 VIEW & CHEST 1 VIEW)

[w chest pa]
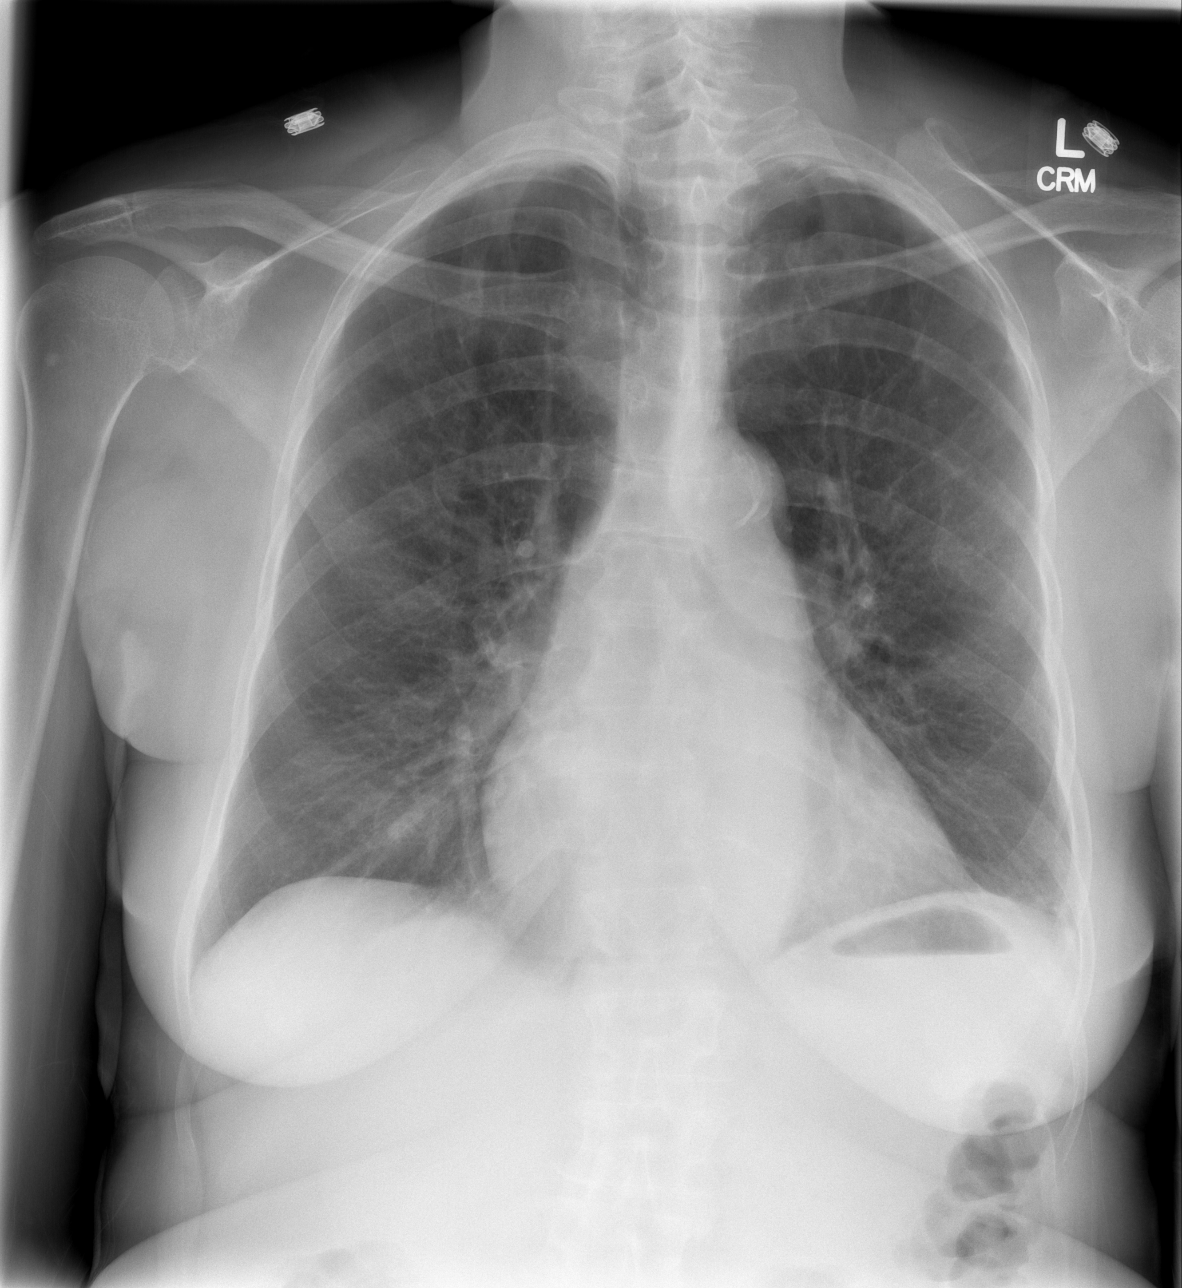

[w abdomen upright]
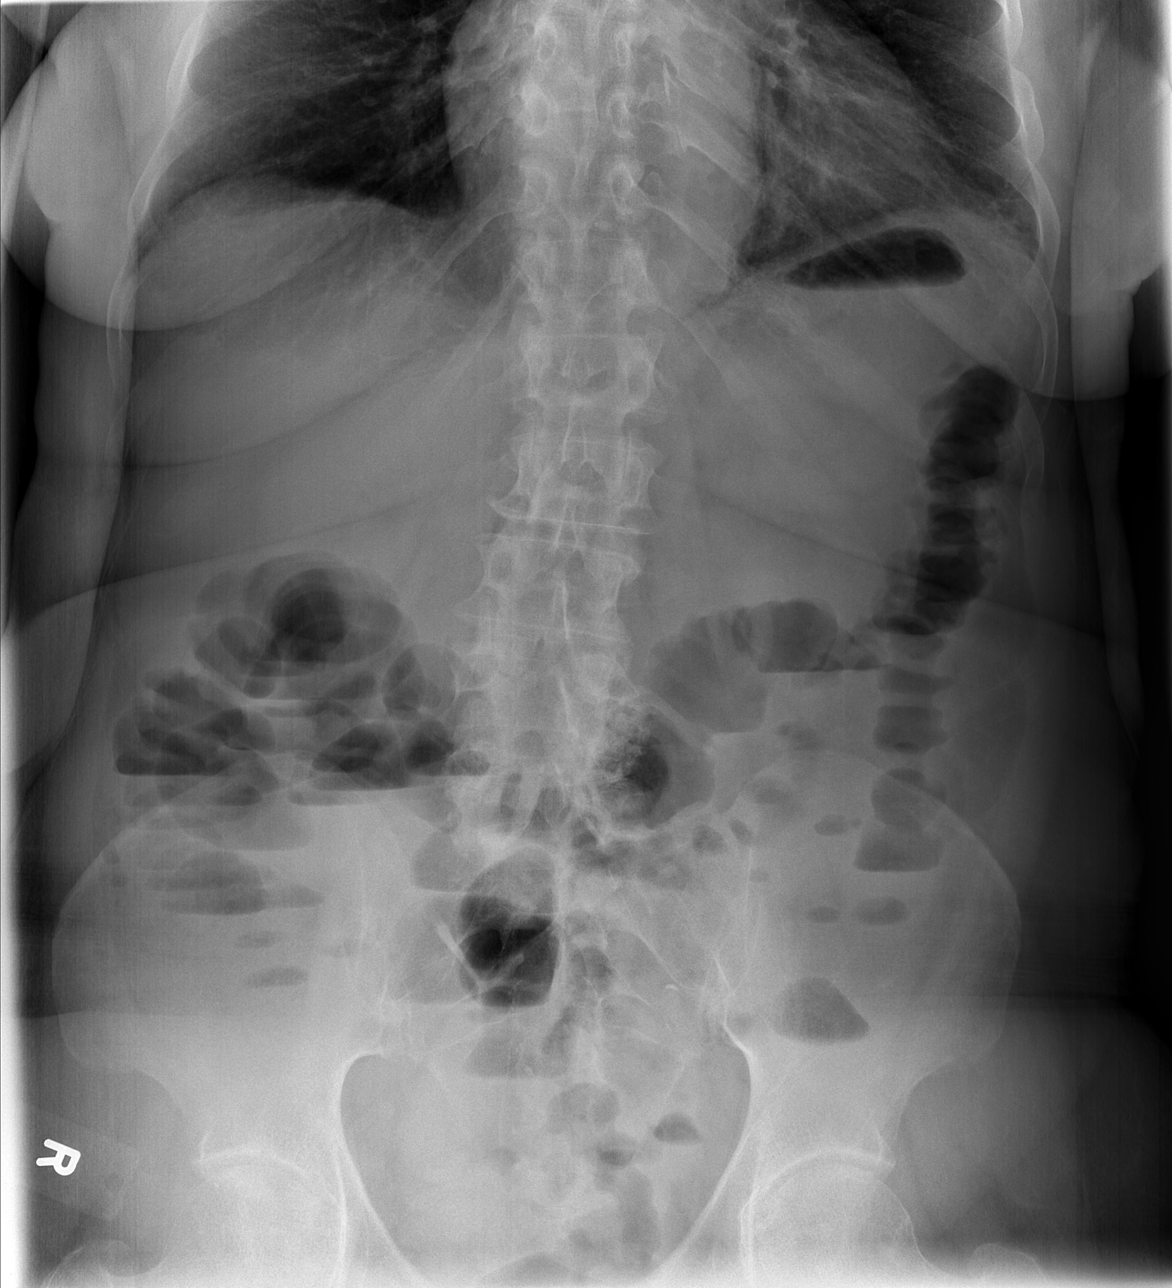

[t abdomen supine]
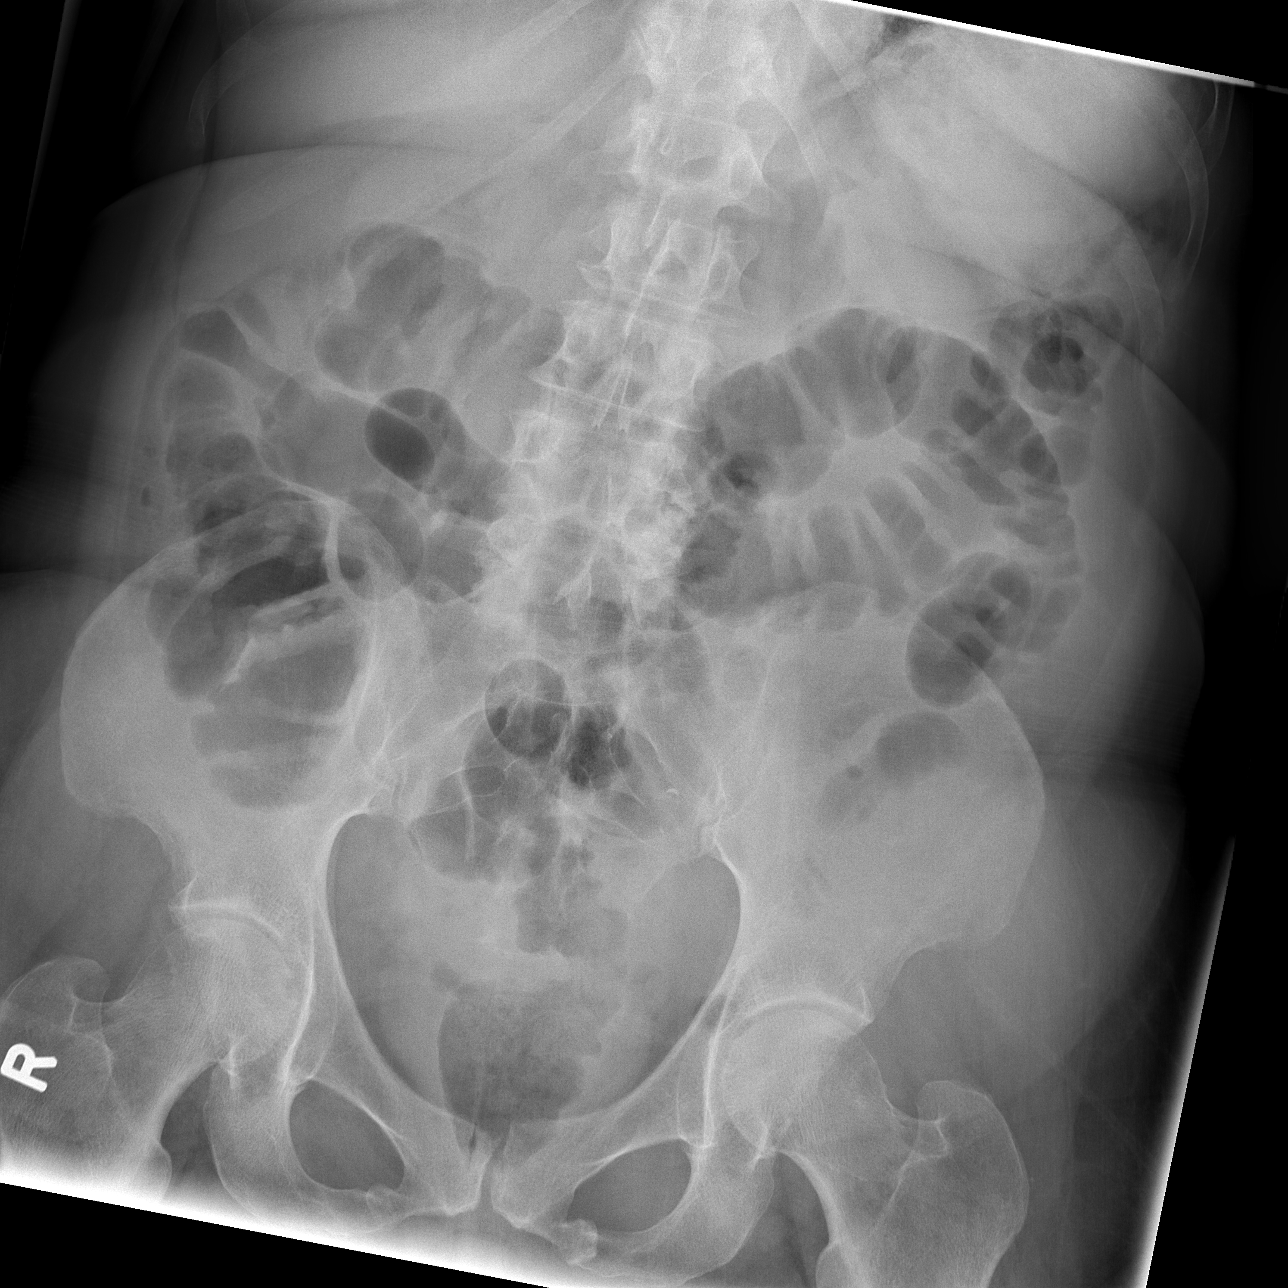

[3 of 3 positions shown; findings below may reference images not displayed]

FINDINGS: Heart size upper normal to mildly enlarged. Mild
interstitial prominence and left lung base scarring.  Aortic arch
atherosclerosis.  No pleural effusion or pneumothorax.

Colonic and distal small bowel gaseous distension is nonspecific
however likely related to recent colonoscopy.  No free
intraperitoneal air identified.  No acute osseous finding. Sequelae
of prior inferior rami fractures.  Organ outlines normal where
seen.
IMPRESSION: Gaseous distension of colon is most in keeping with recent
colonoscopy.  No free intraperitoneal air.

## 2013-03-11 ENCOUNTER — Other Ambulatory Visit: Payer: Self-pay | Admitting: *Deleted

## 2013-03-11 MED ORDER — LOSARTAN POTASSIUM 50 MG PO TABS: 75.0000 mg | ORAL_TABLET | Freq: Every day | ORAL | Status: DC

## 2013-03-11 NOTE — Telephone Encounter (Signed)
rx sent .  Spoke with patient.  She taking 75 mg . At last visit w/extender on 08/21/2012 increase to 100 mg

## 2013-03-16 ENCOUNTER — Telehealth: Payer: Self-pay | Admitting: Cardiovascular Disease

## 2013-03-16 NOTE — Telephone Encounter (Signed)
Dr. Zenaida Deed office called for a medication list for the patient.  Dr. Zenaida Deed is her new primary care provider. 03/16/13. ST.

## 2013-05-13 ENCOUNTER — Encounter (HOSPITAL_COMMUNITY): Payer: Self-pay | Admitting: Emergency Medicine

## 2013-05-13 ENCOUNTER — Emergency Department (HOSPITAL_COMMUNITY)
Admission: EM | Admit: 2013-05-13 | Discharge: 2013-05-14 | Disposition: A | Discharge status: Home / Self Care | Payer: Medicare HMO | Attending: Emergency Medicine | Admitting: Emergency Medicine

## 2013-05-13 DIAGNOSIS — I10 Essential (primary) hypertension: Secondary | ICD-10-CM | POA: Insufficient documentation

## 2013-05-13 DIAGNOSIS — Z79899 Other long term (current) drug therapy: Secondary | ICD-10-CM | POA: Insufficient documentation

## 2013-05-13 DIAGNOSIS — Z8739 Personal history of other diseases of the musculoskeletal system and connective tissue: Secondary | ICD-10-CM | POA: Insufficient documentation

## 2013-05-13 DIAGNOSIS — F17200 Nicotine dependence, unspecified, uncomplicated: Secondary | ICD-10-CM | POA: Insufficient documentation

## 2013-05-13 DIAGNOSIS — R110 Nausea: Secondary | ICD-10-CM | POA: Insufficient documentation

## 2013-05-13 DIAGNOSIS — R1033 Periumbilical pain: Secondary | ICD-10-CM | POA: Insufficient documentation

## 2013-05-13 DIAGNOSIS — Z7982 Long term (current) use of aspirin: Secondary | ICD-10-CM | POA: Insufficient documentation

## 2013-05-13 DIAGNOSIS — R109 Unspecified abdominal pain: Secondary | ICD-10-CM

## 2013-05-13 HISTORY — DX: Sciatica, unspecified side: M54.30

## 2013-05-13 MED ORDER — SODIUM CHLORIDE 0.9 % IV SOLN
INTRAVENOUS | Status: DC
  Administered 2013-05-14: via INTRAVENOUS

## 2013-05-13 MED ORDER — ONDANSETRON HCL 4 MG/2ML IJ SOLN
4.0000 mg | Freq: Once | INTRAMUSCULAR | Status: AC
  Administered 2013-05-14: 4 mg via INTRAVENOUS
  Filled 2013-05-13: qty 2

## 2013-05-13 MED ORDER — FENTANYL CITRATE 0.05 MG/ML IJ SOLN
50.0000 ug | INTRAMUSCULAR | Status: DC | PRN
  Administered 2013-05-14: 50 ug via INTRAVENOUS
  Filled 2013-05-13: qty 2

## 2013-05-13 NOTE — ED Provider Notes (Signed)
History    CSN: [id number] Arrival date & time 05/13/13  2335  First MD Initiated Contact with Patient 05/13/13 2350     Chief Complaint  Patient presents with  . Abdominal Pain   (Consider location/radiation/quality/duration/timing/severity/associated sxs/prior Treatment) HPI Hx per PT - onset 2 days ago, has nausea. No vomiting or diarrhea, some dark stools. 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 today unable to eat anything - she relates this to nausea. Some dark stools today, no blood noted. Tried lidocaine with maalox, alieve today without relief. Took prilosec tonight. Symptoms mod in severity, pain is sharp in quality.   PCP Dr Su Hilt  Past Medical History  Diagnosis Date  . Hypertension   . Sciatica    Past Surgical History  Procedure Laterality Date  . Coronary angioplasty with stent placement     No family history on file. History  Substance Use Topics  . Smoking status: Current Every Day Smoker  . Smokeless tobacco: Not on file  . Alcohol Use: Yes   OB History   Grav Para Term Preterm Abortions TAB SAB Ect Mult Living                 Review of Systems  Constitutional: Negative for fever and chills.  HENT: Negative for neck pain and neck stiffness.   Eyes: Negative for pain.  Respiratory: Negative for shortness of breath.   Cardiovascular: Negative for chest pain.  Gastrointestinal: Positive for nausea and abdominal pain. Negative for vomiting and blood in stool.  Genitourinary: Negative for dysuria.  Musculoskeletal: Negative for back pain.  Skin: Negative for rash.  Neurological: Negative for headaches.  All other systems reviewed and are negative.    Allergies  Paxil  Home Medications   Current Outpatient Rx  Name  Route  Sig  Dispense  Refill  . Ascorbic Acid (VITAMIN C PO)   Oral   Take 1 tablet by mouth 2 (two) times daily.         Marland Kitchen aspirin 81 MG tablet   Oral   Take 81 mg by mouth 2 (two)  times daily.         . Cholecalciferol (VITAMIN D-3 PO)   Oral   Take 1 tablet by mouth daily.         . hydrochlorothiazide (HYDRODIURIL) 50 MG tablet   Oral   Take 50 mg by mouth daily.         Marland Kitchen losartan (COZAAR) 50 MG tablet   Oral   Take 1.5 tablets (75 mg total) by mouth daily.   45 tablet   6   . nitroGLYCERIN (NITROSTAT) 0.4 MG SL tablet   Sublingual   Place 0.4 mg under the tongue every 5 (five) minutes as needed. For chest pain         . omeprazole (PRILOSEC) 20 MG capsule   Oral   Take 20 mg by mouth 2 (two) times daily.         . simvastatin (ZOCOR) 20 MG tablet   Oral   Take 20 mg by mouth at bedtime.          BP 180/90  Pulse 77  Temp(Src) 98.7 F (37.1 C) (Oral)  Resp 18  SpO2 95% Physical Exam  Constitutional: She is oriented to person, place, and time. She appears well-developed and well-nourished.  HENT:  Head: Normocephalic and atraumatic.  Mouth/Throat: Oropharynx is clear and moist.  Eyes: Conjunctivae and EOM are normal.  Pupils are equal, round, and reactive to light. No scleral icterus.  Neck: Neck supple.  Cardiovascular: Normal rate, regular rhythm and intact distal pulses.   Pulmonary/Chest: Effort normal and breath sounds normal. No respiratory distress. She exhibits no tenderness.  Abdominal: Soft. Bowel sounds are normal. She exhibits no distension. There is no rebound and no guarding.  Periumbilical tenderness no acute ABD  Musculoskeletal: Normal range of motion. She exhibits no edema.  Neurological: She is alert and oriented to person, place, and time.  Skin: Skin is warm and dry.    ED Course  Procedures (including critical care time)   Date: 05/14/2013  Rate: 67  Rhythm: normal sinus rhythm  QRS Axis: normal  Intervals: normal  ST/T Wave abnormalities: nonspecific ST changes  Conduction Disutrbances:none  Narrative Interpretation:   Old EKG Reviewed: none available    Results for orders placed during the  hospital encounter of 05/13/13  CBC WITH DIFFERENTIAL      Result Value Range   WBC 11.9 (*) 4.0 - 10.5 K/uL   RBC 4.70  3.87 - 5.11 MIL/uL   Hemoglobin 13.7  12.0 - 15.0 g/dL   HCT 46.9  62.9 - 52.8 %   MCV 83.4  78.0 - 100.0 fL   MCH 29.1  26.0 - 34.0 pg   MCHC 34.9  30.0 - 36.0 g/dL   RDW 41.3 (*) 24.4 - 01.0 %   Platelets 216  150 - 400 K/uL   Neutrophils Relative % 54  43 - 77 %   Neutro Abs 6.4  1.7 - 7.7 K/uL   Lymphocytes Relative 35  12 - 46 %   Lymphs Abs 4.2 (*) 0.7 - 4.0 K/uL   Monocytes Relative 7  3 - 12 %   Monocytes Absolute 0.8  0.1 - 1.0 K/uL   Eosinophils Relative 4  0 - 5 %   Eosinophils Absolute 0.4  0.0 - 0.7 K/uL   Basophils Relative 0  0 - 1 %   Basophils Absolute 0.0  0.0 - 0.1 K/uL  COMPREHENSIVE METABOLIC PANEL      Result Value Range   Sodium 143  135 - 145 mEq/L   Potassium 3.5  3.5 - 5.1 mEq/L   Chloride 106  96 - 112 mEq/L   CO2 27  19 - 32 mEq/L   Glucose, Bld 96  70 - 99 mg/dL   BUN 15  6 - 23 mg/dL   Creatinine, Ser 2.72 (*) 0.50 - 1.10 mg/dL   Calcium 9.9  8.4 - 53.6 mg/dL   Total Protein 6.8  6.0 - 8.3 g/dL   Albumin 3.9  3.5 - 5.2 g/dL   AST 18  0 - 37 U/L   ALT 10  0 - 35 U/L   Alkaline Phosphatase 64  39 - 117 U/L   Total Bilirubin 0.2 (*) 0.3 - 1.2 mg/dL   GFR calc non Af Amer 34 (*) >90 mL/min   GFR calc Af Amer 40 (*) >90 mL/min  LIPASE, BLOOD      Result Value Range   Lipase 13  11 - 59 U/L  URINALYSIS, ROUTINE W REFLEX MICROSCOPIC      Result Value Range   Color, Urine YELLOW  YELLOW   APPearance CLOUDY (*) CLEAR   Specific Gravity, Urine 1.015  1.005 - 1.030   pH 5.5  5.0 - 8.0   Glucose, UA NEGATIVE  NEGATIVE mg/dL   Hgb urine dipstick NEGATIVE  NEGATIVE   Bilirubin  Urine NEGATIVE  NEGATIVE   Ketones, ur NEGATIVE  NEGATIVE mg/dL   Protein, ur NEGATIVE  NEGATIVE mg/dL   Urobilinogen, UA 0.2  0.0 - 1.0 mg/dL   Nitrite NEGATIVE  NEGATIVE   Leukocytes, UA SMALL (*) NEGATIVE  URINE MICROSCOPIC-ADD ON      Result Value  Range   Squamous Epithelial / LPF MANY (*) RARE   WBC, UA 3-6  <3 WBC/hpf   RBC / HPF 0-2  <3 RBC/hpf   Bacteria, UA FEW (*) RARE  OCCULT BLOOD, POC DEVICE      Result Value Range   Fecal Occult Bld NEGATIVE  NEGATIVE  POCT I-STAT TROPONIN I      Result Value Range   Troponin i, poc 0.02  0.00 - 0.08 ng/mL   Comment 3            Ct Abdomen Pelvis W Contrast  05/14/2013   *RADIOLOGY REPORT*  Clinical Data: Diffuse mid abdominal pain and nausea.  CT ABDOMEN AND PELVIS WITH CONTRAST  Technique:  Multidetector CT imaging of the abdomen and pelvis was performed following the standard protocol during bolus administration of intravenous contrast.  Contrast: 80mL OMNIPAQUE IOHEXOL 300 MG/ML  SOLN  Comparison: Abdominal radiograph performed 05/30/2012  Findings: Minimal bibasilar atelectasis is noted.  Scattered hypodense lesions within the liver are nonspecific; these may reflect small cysts, given the patient's normal LFTs.  The liver is otherwise unremarkable in appearance.  The spleen is within normal limits.  The gallbladder is grossly unremarkable. The pancreas is normal in appearance.  Prominence of the adrenal glands raises question for mild adrenal hyperplasia.  Bilateral renal cysts are seen, measuring 3.8 cm on the right and 2.2 cm on the left.  Mild nonspecific perinephric stranding is noted bilaterally.  The kidneys are otherwise unremarkable in appearance.  No renal or ureteral stones are seen.  There is no evidence of hydronephrosis.  No free fluid is identified.  The small bowel is unremarkable in appearance.  The stomach is within normal limits.  No acute vascular abnormalities are seen.  Scattered calcification is noted along the abdominal aorta and its branches.  The appendix is normal in caliber and contains air, without evidence for appendicitis.  Scattered diverticulosis is noted along the ascending, distal transverse and descending colon, without definite evidence of diverticulitis.   Minimal surrounding stranding is thought to reflect normal vasculature.  The bladder is mildly distended and grossly unremarkable in appearance.  The uterus is within normal limits.  The ovaries are grossly symmetric; no suspicious adnexal masses are seen.  No inguinal lymphadenopathy is seen.  No acute osseous abnormalities are identified.  IMPRESSION:  1.  No definite acute abnormality seen to explain the patient's symptoms. 2.  Scattered diverticulosis along the ascending, distal transverse and descending colon, without definite evidence of diverticulitis. 3.  Nonspecific small hypodense lesions throughout the liver; these may reflect small cysts, given the patient's normal LFTs.  4.  Bilateral renal cysts seen. 5.  Scattered calcification along the abdominal aorta and its branches. 6.  Suspect mild adrenal hyperplasia.   Original Report Authenticated By: Tonia Ghent, M.D.   IV Protonix, Zofran and fentanyl.   IVFs provided.  3:13 AM on recheck has been asymptomatic since receiving medications. She is requesting something to eat and something to drink and to be discharged home. Her last endoscopy was about a year ago she has GI followup through the [facility] hospital. Repeat abdominal exam is benign without tenderness or peritonitis  Plan: Continue omeprazole as prescribed twice daily. Prescription for Phenergan and Ultram provided. Patient agrees to avoid Aleve with her history of peptic ulcer disease. She will call her physician in the morning to schedule followup and recheck. She agrees to strict return precautions. Diet instructions provided.   MDM  Abdominal pain with history of peptic ulcer disease  EKG. Labs. CT scan.  Improved with IV fluids, narcotics and medications  Vital signs and nursing notes reviewed and considered  Sunnie Nielsen, MD 05/14/13 [phone]

## 2013-05-13 NOTE — ED Notes (Signed)
PT. REPORTS MID ABDOMINAL PAIN WITH NAUSEA AND BLACK STOOLS , DENIES EMESIS OR DIARRHEA.

## 2013-05-14 ENCOUNTER — Emergency Department (HOSPITAL_COMMUNITY): Payer: Medicare HMO

## 2013-05-14 ENCOUNTER — Encounter (HOSPITAL_COMMUNITY): Payer: Self-pay | Admitting: Radiology

## 2013-05-14 LAB — URINALYSIS, ROUTINE W REFLEX MICROSCOPIC
Bilirubin Urine: NEGATIVE
Ketones, ur: NEGATIVE mg/dL
Nitrite: NEGATIVE
Protein, ur: NEGATIVE mg/dL
Urobilinogen, UA: 0.2 mg/dL (ref 0.0–1.0)
pH: 5.5 (ref 5.0–8.0)

## 2013-05-14 LAB — COMPREHENSIVE METABOLIC PANEL
Albumin: 3.9 g/dL (ref 3.5–5.2)
Alkaline Phosphatase: 64 U/L (ref 39–117)
BUN: 15 mg/dL (ref 6–23)
Chloride: 106 mEq/L (ref 96–112)
Creatinine, Ser: 1.48 mg/dL — ABNORMAL HIGH (ref 0.50–1.10)
GFR calc Af Amer: 40 mL/min — ABNORMAL LOW (ref >90)
GFR calc non Af Amer: 34 mL/min — ABNORMAL LOW (ref >90)
Glucose, Bld: 96 mg/dL (ref 70–99)
Total Bilirubin: 0.2 mg/dL — ABNORMAL LOW (ref 0.3–1.2)

## 2013-05-14 LAB — CBC WITH DIFFERENTIAL/PLATELET
Basophils Relative: 0 % (ref 0–1)
HCT: 39.2 % (ref 36.0–46.0)
Hemoglobin: 13.7 g/dL (ref 12.0–15.0)
Lymphs Abs: 4.2 10*3/uL — ABNORMAL HIGH (ref 0.7–4.0)
MCH: 29.1 pg (ref 26.0–34.0)
MCHC: 34.9 g/dL (ref 30.0–36.0)
Monocytes Absolute: 0.8 10*3/uL (ref 0.1–1.0)
Monocytes Relative: 7 % (ref 3–12)
Neutro Abs: 6.4 10*3/uL (ref 1.7–7.7)
RBC: 4.7 MIL/uL (ref 3.87–5.11)

## 2013-05-14 LAB — LIPASE, BLOOD: Lipase: 13 U/L (ref 11–59)

## 2013-05-14 LAB — URINE MICROSCOPIC-ADD ON

## 2013-05-14 MED ORDER — IOHEXOL 300 MG/ML  SOLN: 50.0000 mL | Freq: Once | INTRAMUSCULAR | Status: DC | PRN

## 2013-05-14 MED ORDER — IOHEXOL 300 MG/ML  SOLN
80.0000 mL | Freq: Once | INTRAMUSCULAR | Status: AC | PRN
  Administered 2013-05-14: 80 mL via INTRAVENOUS

## 2013-05-14 MED ORDER — PANTOPRAZOLE SODIUM 40 MG IV SOLR
40.0000 mg | Freq: Once | INTRAVENOUS | Status: AC
  Administered 2013-05-14: 40 mg via INTRAVENOUS
  Filled 2013-05-14: qty 40

## 2013-05-14 MED ORDER — TRAMADOL HCL 50 MG PO TABS: 50.0000 mg | ORAL_TABLET | Freq: Four times a day (QID) | ORAL | Status: DC | PRN

## 2013-05-14 MED ORDER — ONDANSETRON 4 MG PO TBDP
4.0000 mg | ORAL_TABLET | Freq: Once | ORAL | Status: AC
  Administered 2013-05-14: 4 mg via ORAL
  Filled 2013-05-14: qty 1

## 2013-05-14 MED ORDER — PROMETHAZINE HCL 6.25 MG/5ML PO SYRP: 25.0000 mg | ORAL_SOLUTION | Freq: Four times a day (QID) | ORAL | Status: DC | PRN

## 2013-05-14 MED ORDER — IOHEXOL 300 MG/ML  SOLN
50.0000 mL | Freq: Once | INTRAMUSCULAR | Status: DC | PRN
  Administered 2013-05-14: 50 mL via ORAL

## 2013-05-14 MED ORDER — GI COCKTAIL ~~LOC~~
30.0000 mL | Freq: Once | ORAL | Status: AC
  Administered 2013-05-14: 30 mL via ORAL
  Filled 2013-05-14: qty 30

## 2013-05-15 LAB — URINE CULTURE: Colony Count: >=100000

## 2013-06-08 ENCOUNTER — Other Ambulatory Visit: Payer: Self-pay | Admitting: *Deleted

## 2013-06-08 MED ORDER — OMEPRAZOLE 20 MG PO CPDR: 20.0000 mg | DELAYED_RELEASE_CAPSULE | Freq: Two times a day (BID) | ORAL | Status: DC

## 2013-06-08 NOTE — Telephone Encounter (Signed)
Rx was sent to pharmacy electronically. 

## 2013-07-04 ENCOUNTER — Encounter (HOSPITAL_COMMUNITY): Payer: Self-pay | Admitting: *Deleted

## 2013-07-04 ENCOUNTER — Emergency Department (HOSPITAL_COMMUNITY)
Admission: EM | Admit: 2013-07-04 | Discharge: 2013-07-04 | Discharge status: Against Medical Advice | Payer: Worker's Compensation | Attending: Emergency Medicine | Admitting: Emergency Medicine

## 2013-07-04 DIAGNOSIS — S0990XA Unspecified injury of head, initial encounter: Secondary | ICD-10-CM | POA: Insufficient documentation

## 2013-07-04 DIAGNOSIS — Y929 Unspecified place or not applicable: Secondary | ICD-10-CM | POA: Insufficient documentation

## 2013-07-04 DIAGNOSIS — W1809XA Striking against other object with subsequent fall, initial encounter: Secondary | ICD-10-CM | POA: Insufficient documentation

## 2013-07-04 DIAGNOSIS — Z9861 Coronary angioplasty status: Secondary | ICD-10-CM | POA: Insufficient documentation

## 2013-07-04 DIAGNOSIS — I10 Essential (primary) hypertension: Secondary | ICD-10-CM | POA: Insufficient documentation

## 2013-07-04 DIAGNOSIS — Y9389 Activity, other specified: Secondary | ICD-10-CM | POA: Insufficient documentation

## 2013-07-04 DIAGNOSIS — W010XXA Fall on same level from slipping, tripping and stumbling without subsequent striking against object, initial encounter: Secondary | ICD-10-CM | POA: Insufficient documentation

## 2013-07-04 DIAGNOSIS — F17200 Nicotine dependence, unspecified, uncomplicated: Secondary | ICD-10-CM | POA: Insufficient documentation

## 2013-07-04 NOTE — ED Notes (Signed)
No answer

## 2013-07-04 NOTE — ED Notes (Signed)
Pt in stating she tripped and fell and hit her chin on the driveway, pt denies LOC, broke her front two teeth, abrasion noted to chin and to upper lip

## 2013-07-04 NOTE — ED Notes (Signed)
Informed by NS that pt left.

## 2013-07-06 ENCOUNTER — Other Ambulatory Visit: Payer: Self-pay | Admitting: *Deleted

## 2013-07-06 MED ORDER — ATENOLOL 50 MG PO TABS: 50.0000 mg | ORAL_TABLET | Freq: Every day | ORAL | Status: DC

## 2013-07-06 MED ORDER — SIMVASTATIN 20 MG PO TABS: 20.0000 mg | ORAL_TABLET | Freq: Every day | ORAL | Status: DC

## 2013-07-06 NOTE — Telephone Encounter (Signed)
Rx was sent to pharmacy electronically. 

## 2013-08-04 ENCOUNTER — Other Ambulatory Visit: Payer: Self-pay | Admitting: Cardiovascular Disease

## 2013-08-05 NOTE — Telephone Encounter (Signed)
Rx was sent to pharmacy electronically. 

## 2013-10-30 ENCOUNTER — Other Ambulatory Visit: Payer: Self-pay | Admitting: Cardiovascular Disease

## 2013-10-30 NOTE — Telephone Encounter (Signed)
Rx was sent to pharmacy electronically. 

## 2013-12-04 ENCOUNTER — Other Ambulatory Visit: Payer: Self-pay | Admitting: Cardiovascular Disease

## 2013-12-07 NOTE — Telephone Encounter (Signed)
Rx was sent to pharmacy electronically. 

## 2013-12-31 ENCOUNTER — Other Ambulatory Visit: Payer: Self-pay | Admitting: Cardiovascular Disease

## 2014-01-27 ENCOUNTER — Other Ambulatory Visit: Payer: Self-pay | Admitting: Cardiovascular Disease

## 2014-01-27 NOTE — Telephone Encounter (Signed)
Rx denied. Patient needs an appointment and has had several warnings.

## 2014-02-04 ENCOUNTER — Observation Stay (HOSPITAL_COMMUNITY)
Admission: EM | Admit: 2014-02-04 | Discharge: 2014-02-06 | Disposition: A | Discharge status: Home / Self Care | Payer: Medicare HMO | Attending: Internal Medicine | Admitting: Internal Medicine

## 2014-02-04 ENCOUNTER — Encounter (HOSPITAL_COMMUNITY): Payer: Self-pay | Admitting: Emergency Medicine

## 2014-02-04 ENCOUNTER — Emergency Department (HOSPITAL_COMMUNITY): Payer: Medicare HMO

## 2014-02-04 DIAGNOSIS — N183 Chronic kidney disease, stage 3 unspecified: Secondary | ICD-10-CM | POA: Insufficient documentation

## 2014-02-04 DIAGNOSIS — I10 Essential (primary) hypertension: Secondary | ICD-10-CM | POA: Diagnosis present

## 2014-02-04 DIAGNOSIS — Z7982 Long term (current) use of aspirin: Secondary | ICD-10-CM | POA: Insufficient documentation

## 2014-02-04 DIAGNOSIS — I498 Other specified cardiac arrhythmias: Secondary | ICD-10-CM | POA: Insufficient documentation

## 2014-02-04 DIAGNOSIS — I2510 Atherosclerotic heart disease of native coronary artery without angina pectoris: Secondary | ICD-10-CM

## 2014-02-04 DIAGNOSIS — F17200 Nicotine dependence, unspecified, uncomplicated: Secondary | ICD-10-CM | POA: Insufficient documentation

## 2014-02-04 DIAGNOSIS — I129 Hypertensive chronic kidney disease with stage 1 through stage 4 chronic kidney disease, or unspecified chronic kidney disease: Secondary | ICD-10-CM | POA: Insufficient documentation

## 2014-02-04 DIAGNOSIS — Z79899 Other long term (current) drug therapy: Secondary | ICD-10-CM | POA: Insufficient documentation

## 2014-02-04 DIAGNOSIS — R55 Syncope and collapse: Principal | ICD-10-CM | POA: Insufficient documentation

## 2014-02-04 LAB — CBC
HCT: 35.4 % — ABNORMAL LOW (ref 36.0–46.0)
Hemoglobin: 11.6 g/dL — ABNORMAL LOW (ref 12.0–15.0)
MCH: 27 pg (ref 26.0–34.0)
MCHC: 32.8 g/dL (ref 30.0–36.0)
MCV: 82.5 fL (ref 78.0–100.0)
PLATELETS: 153 10*3/uL (ref 150–400)
RBC: 4.29 MIL/uL (ref 3.87–5.11)
RDW: 16.3 % — AB (ref 11.5–15.5)
WBC: 6.9 10*3/uL (ref 4.0–10.5)

## 2014-02-04 LAB — I-STAT TROPONIN, ED: TROPONIN I, POC: 0.01 ng/mL (ref 0.00–0.08)

## 2014-02-04 LAB — URINALYSIS, ROUTINE W REFLEX MICROSCOPIC
Glucose, UA: NEGATIVE mg/dL
HGB URINE DIPSTICK: NEGATIVE
Ketones, ur: NEGATIVE mg/dL
LEUKOCYTES UA: NEGATIVE
Nitrite: NEGATIVE
Protein, ur: 100 mg/dL — AB
Specific Gravity, Urine: 1.028 (ref 1.005–1.030)
UROBILINOGEN UA: 1 mg/dL (ref 0.0–1.0)
pH: 5.5 (ref 5.0–8.0)

## 2014-02-04 LAB — COMPREHENSIVE METABOLIC PANEL
ALK PHOS: 67 U/L (ref 39–117)
ALT: 7 U/L (ref 0–35)
AST: 17 U/L (ref 0–37)
Albumin: 3.4 g/dL — ABNORMAL LOW (ref 3.5–5.2)
BUN: 17 mg/dL (ref 6–23)
CALCIUM: 9.4 mg/dL (ref 8.4–10.5)
CO2: 22 mEq/L (ref 19–32)
Chloride: 108 mEq/L (ref 96–112)
Creatinine, Ser: 1.36 mg/dL — ABNORMAL HIGH (ref 0.50–1.10)
GFR calc non Af Amer: 38 mL/min — ABNORMAL LOW (ref >90)
GFR, EST AFRICAN AMERICAN: 44 mL/min — AB (ref >90)
GLUCOSE: 91 mg/dL (ref 70–99)
POTASSIUM: 3.7 meq/L (ref 3.7–5.3)
Sodium: 144 mEq/L (ref 137–147)
Total Bilirubin: 0.3 mg/dL (ref 0.3–1.2)
Total Protein: 6.2 g/dL (ref 6.0–8.3)

## 2014-02-04 LAB — CBG MONITORING, ED: Glucose-Capillary: 86 mg/dL (ref 70–99)

## 2014-02-04 LAB — TROPONIN I: Troponin I: <0.3 ng/mL (ref <0.30)

## 2014-02-04 LAB — URINE MICROSCOPIC-ADD ON

## 2014-02-04 MED ORDER — ASPIRIN 81 MG PO CHEW
81.0000 mg | CHEWABLE_TABLET | Freq: Two times a day (BID) | ORAL | Status: DC
  Administered 2014-02-04 – 2014-02-05 (×3): 81 mg via ORAL
  Filled 2014-02-04 (×6): qty 1

## 2014-02-04 MED ORDER — MIRTAZAPINE 30 MG PO TABS
30.0000 mg | ORAL_TABLET | Freq: Every day | ORAL | Status: DC
  Administered 2014-02-04 – 2014-02-05 (×2): 30 mg via ORAL
  Filled 2014-02-04 (×4): qty 1

## 2014-02-04 MED ORDER — HEPARIN SODIUM (PORCINE) 5000 UNIT/ML IJ SOLN
5000.0000 [IU] | Freq: Three times a day (TID) | INTRAMUSCULAR | Status: DC
  Administered 2014-02-04 – 2014-02-06 (×6): 5000 [IU] via SUBCUTANEOUS
  Filled 2014-02-04 (×9): qty 1

## 2014-02-04 MED ORDER — HYDRALAZINE HCL 20 MG/ML IJ SOLN
5.0000 mg | INTRAMUSCULAR | Status: DC | PRN
  Administered 2014-02-05 (×2): 5 mg via INTRAVENOUS
  Filled 2014-02-04 (×2): qty 0.25

## 2014-02-04 MED ORDER — SODIUM CHLORIDE 0.9 % IV BOLUS (SEPSIS)
1000.0000 mL | INTRAVENOUS | Status: AC
  Administered 2014-02-04: 1000 mL via INTRAVENOUS

## 2014-02-04 MED ORDER — ALUM & MAG HYDROXIDE-SIMETH 200-200-20 MG/5ML PO SUSP: 5.0000 mL | Freq: Four times a day (QID) | ORAL | Status: DC | PRN

## 2014-02-04 MED ORDER — ISOSORBIDE MONONITRATE ER 30 MG PO TB24
30.0000 mg | ORAL_TABLET | Freq: Every day | ORAL | Status: DC
  Filled 2014-02-04: qty 1

## 2014-02-04 MED ORDER — LOSARTAN POTASSIUM 50 MG PO TABS
75.0000 mg | ORAL_TABLET | Freq: Every day | ORAL | Status: DC
  Administered 2014-02-04 – 2014-02-05 (×2): 75 mg via ORAL
  Filled 2014-02-04 (×3): qty 1

## 2014-02-04 NOTE — ED Notes (Signed)
[facility] pt's request [facility] to let them know that pt is still in the hospital and to ask to prevent her [facility] from [facility]. Spoke with a receptionist who assured me they will make sure her car stays at their parking lot.

## 2014-02-04 NOTE — ED Provider Notes (Signed)
CSN: [id number]     Arrival date & time 02/04/14  1114 History   First MD Initiated Contact with Patient 02/04/14 1142     Chief Complaint  Patient presents with  . Near Syncope     (Consider location/radiation/quality/duration/timing/severity/associated sxs/prior Treatment) Patient is a 73 y.o. female presenting with syncope. The history is provided by the patient.  Loss of Consciousness Episode history:  Single Most recent episode:  Today Timing: once. Progression:  Resolved Chronicity: one time previously. Context comment:  Activity Witnessed: yes   Relieved by:  Nothing Worsened by:  Nothing tried Ineffective treatments:  None tried Associated symptoms: no dizziness, no fever, no nausea and no vomiting     Past Medical History  Diagnosis Date  . Hypertension   . Sciatica    Past Surgical History  Procedure Laterality Date  . Coronary angioplasty with stent placement     No family history on file. History  Substance Use Topics  . Smoking status: Current Every Day Smoker  . Smokeless tobacco: Not on file  . Alcohol Use: Yes   OB History   Grav Para Term Preterm Abortions TAB SAB Ect Mult Living                 Review of Systems  Constitutional: Negative for fever and fatigue.  HENT: Negative for congestion and drooling.   Eyes: Negative for pain.  Respiratory: Positive for cough (mild, dry).   Cardiovascular: Positive for syncope.  Gastrointestinal: Negative for nausea, vomiting and diarrhea.  Genitourinary: Negative for dysuria and hematuria.  Musculoskeletal: Negative for back pain, gait problem and neck pain.  Skin: Negative for color change.  Neurological: Negative for dizziness.  Hematological: Negative for adenopathy.  Psychiatric/Behavioral: Negative for behavioral problems.  All other systems reviewed and are negative.     Allergies  Paxil  Home Medications   Current Outpatient Rx  Name  Route  Sig  Dispense  Refill  . acetaminophen  (TYLENOL) 500 MG tablet   Oral   Take 500 mg by mouth every 6 (six) hours as needed for pain.         Marland Kitchen. alum & mag hydroxide-simeth (MAALOX PLUS) 400-400-40 MG/5ML suspension   Oral   Take 5 mLs by mouth every 6 (six) hours as needed for indigestion.         Marland Kitchen. aspirin 81 MG tablet   Oral   Take 81 mg by mouth 2 (two) times daily.         Marland Kitchen. atenolol (TENORMIN) 100 MG tablet   Oral   Take 100 mg by mouth at bedtime.         . isosorbide mononitrate (IMDUR) 30 MG 24 hr tablet      take 1 tablet by mouth once daily NEED APPOINTMENT FOR REFILLS   15 tablet   0     PATIENT NEEDS AN APPOINTMENT FOR FUTURE REFILLS.   Marland Kitchen. losartan (COZAAR) 50 MG tablet   Oral   Take 1.5 tablets (75 mg total) by mouth daily.   45 tablet   6   . atenolol (TENORMIN) 50 MG tablet   Oral   Take 1 tablet (50 mg total) by mouth daily.   30 tablet   1     Patient needs appointment before future refills wi ...   . CALCIUM-VITAMIN D PO   Oral   Take 1 tablet by mouth daily.         . Cholecalciferol (VITAMIN D-3 PO)  Oral   Take 1 tablet by mouth daily.         Marland Kitchen lidocaine (XYLOCAINE) 2 % solution   Oral   Take 5 mLs by mouth 4 (four) times daily as needed for pain.         . mirtazapine (REMERON) 30 MG tablet   Oral   Take 30 mg by mouth at bedtime.         . naproxen sodium (ANAPROX) 220 MG tablet   Oral   Take 220 mg by mouth daily as needed (for pain).         . nitroGLYCERIN (NITROLINGUAL) 0.4 MG/SPRAY spray   Sublingual   Place 1 spray under the tongue every 5 (five) minutes as needed for chest pain.          BP 188/83  Pulse 52  Temp(Src) 98.2 F (36.8 C) (Oral)  Resp 16  SpO2 96% Physical Exam  Nursing note and vitals reviewed. Constitutional: She is oriented to person, place, and time. She appears well-developed and well-nourished.  HENT:  Head: Normocephalic.  Mouth/Throat: No oropharyngeal exudate.  Eyes: Conjunctivae and EOM are normal. Pupils are  equal, round, and reactive to light.  Neck: Normal range of motion. Neck supple.  No vertebral ttp.   Cardiovascular: Normal rate, regular rhythm, normal heart sounds and intact distal pulses.  Exam reveals no gallop and no friction rub.   No murmur heard. Pulmonary/Chest: Effort normal and breath sounds normal. No respiratory distress. She has no wheezes.  Abdominal: Soft. Bowel sounds are normal. There is no tenderness. There is no rebound and no guarding.  Musculoskeletal: Normal range of motion. She exhibits no edema and no tenderness.  Neurological: She is alert and oriented to person, place, and time.  alert, oriented x3 speech: normal in context and clarity memory: intact grossly cranial nerves II-XII: intact motor strength: full proximally and distally no involuntary movements or tremors sensation: intact to light touch diffusely  cerebellar: finger-to-nose and heel-to-shin intact gait: normal forwards and backwards, normal tandem gait   Skin: Skin is warm and dry.  Psychiatric: She has a normal mood and affect. Her behavior is normal.    ED Course  Procedures (including critical care time) Labs Review Labs Reviewed  CBC - Abnormal; Notable for the following:    Hemoglobin 11.6 (*)    HCT 35.4 (*)    RDW 16.3 (*)    All other components within normal limits  COMPREHENSIVE METABOLIC PANEL - Abnormal; Notable for the following:    Creatinine, Ser 1.36 (*)    Albumin 3.4 (*)    GFR calc non Af Amer 38 (*)    GFR calc Af Amer 44 (*)    All other components within normal limits  URINALYSIS, ROUTINE W REFLEX MICROSCOPIC  CBG MONITORING, ED  Rosezena Sensor, ED   Imaging Review Dg Chest 2 View  02/04/2014   CLINICAL DATA:  73 year old female with near syncope. Initial encounter.  EXAM: CHEST  2 VIEW  COMPARISON:  05/08/2012.  FINDINGS: Portable AP and lateral semi upright view at 1154 hr. Lower lung volumes. Stable cardiomegaly and mediastinal contours. Visualized  tracheal air column is within normal limits. No pneumothorax, pulmonary edema, pleural effusion or confluent pulmonary opacity. No acute osseous abnormality identified.  IMPRESSION: Lower lung volumes, otherwise no acute cardiopulmonary abnormality.   Electronically Signed   By: Augusto Gamble M.D.   On: 02/04/2014 12:04     EKG Interpretation   Date/Time:  Thursday  February 04 2014 11:23:16 EDT Ventricular Rate:  59 PR Interval:  165 QRS Duration: 83 QT Interval:  453 QTC Calculation: 449 R Axis:   -24 Text Interpretation:  Sinus rhythm Left ventricular hypertrophy Anterior  ST elevation, probably due to LVH No significant change since last tracing  Confirmed by Neysha Criado  MD, Statia Burdick ([id number]) on 02/04/2014 12:05:18 PM      MDM   Final diagnoses:  Syncope    12:52 PM 73 y.o. female w hx of CAD who presents with a syncopal episode which occurred prior to arrival. The patient was performing chair yoga  for the first time at the [facility] when she lifted her arms up in the air became lightheaded and syncopized falling from a chair to the ground. She denies any symptoms of chest pain or shortness of breath and is currently asymptomatic. She is afebrile and mildly bradycardic here. She has a normal neurologic exam. Will get screening labwork, IV fluid, orthostatic vital signs.  She denies headache and no evidence of head injury on my exam.  Found to be orthostatic. Pt notes she was admitted for syncope in the past and that was when she got her stent. Given age, hx of CAD, orthostatic VS. I think she would benefit from admission and obs.  The patient will be admitted to triad hospitalist.      Junius Argyle, MD 02/04/14 [phone]

## 2014-02-04 NOTE — ED Notes (Signed)
Bed: WA09 Expected date:  Expected time:  Means of arrival:  Comments: ems- elderly, syncopal episode?

## 2014-02-04 NOTE — Progress Notes (Signed)
Per pt, her purse had [facility]. Pt requested that we arrange for purse to be retrieved for her. Security was called, and was unable to perform requested task. CN was notified, and [place] [facility] [place]. [employer] arranged for purse to be retrieved from parking lot and be brought to pt. Pt now has her purse at bedside.

## 2014-02-04 NOTE — H&P (Signed)
History and Physical    Jennifer CowdenCarolyn L Carpenter JXB:[id number] DOB: 03-Oct-1941 DOA: 02/04/2014  Referring physician: Dr. Romeo AppleHarrison PCP: Lorenda PeckOBERTS, Jennifer WAYNE, MD  Specialists: none  Chief Complaint: syncope  HPI: [name] is a 73 y.o. female has a past medical history significant for CAD s/p cath and DES in 2010, recurrent syncopal episodes in the past but none recently, HTN, presents to the ED [facility] while undergoing her first yoga class. She denies chest pain, palpitations, denies lightheadedness or dizziness, but feels weak overall. Has no GI or GU symptoms and is otherwise asymptomatic now. There is currently some confusion about recent changes in her medications, as she is telling me that one of her medications has been recently increased, but she is not sure which one. [facility]. Patient tells me that she did not had a chance to get the new dose however mentioned to the ED RN that she did. Based on previous ED visits here (most recent one July 2014), she does not appear that she was on Atenolol in the past but patient started that she has been taking that for a "long" time, unsure how long.   Review of Systems: as per HPI otherwise negative.   Past Medical History  Diagnosis Date  . Hypertension   . Sciatica    Past Surgical History  Procedure Laterality Date  . Coronary angioplasty with stent placement     Social History:  reports that she has been smoking.  She does not have any smokeless tobacco history on file. She reports that she drinks alcohol. She reports that she does not use illicit drugs.  Allergies  Allergen Reactions  . Paxil [Paroxetine Hcl] Other (See Comments)    unknown   Family history non contributory.  Prior to Admission medications   Medication Sig Start Date End Date Taking? Authorizing Provider  acetaminophen (TYLENOL) 500 MG tablet Take 500 mg by mouth every 6 (six) hours as needed for pain.   Yes Historical  Provider, MD  alum & mag hydroxide-simeth (MAALOX PLUS) 400-400-40 MG/5ML suspension Take 5 mLs by mouth every 6 (six) hours as needed for indigestion.   Yes Historical Provider, MD  aspirin 81 MG tablet Take 81 mg by mouth 2 (two) times daily.   Yes Historical Provider, MD  atenolol (TENORMIN) 100 MG tablet Take 100 mg by mouth at bedtime.   Yes Historical Provider, MD  isosorbide mononitrate (IMDUR) 30 MG 24 hr tablet take 1 tablet by mouth once daily NEED APPOINTMENT FOR REFILLS 12/04/13  Yes Runell GessJonathan J Berry, MD  losartan (COZAAR) 50 MG tablet Take 1.5 tablets (75 mg total) by mouth daily. 03/11/13  Yes Runell GessJonathan J Berry, MD  CALCIUM-VITAMIN D PO Take 1 tablet by mouth daily.    Historical Provider, MD  Cholecalciferol (VITAMIN D-3 PO) Take 1 tablet by mouth daily.    Historical Provider, MD  lidocaine (XYLOCAINE) 2 % solution Take 5 mLs by mouth 4 (four) times daily as needed for pain.    Historical Provider, MD  mirtazapine (REMERON) 30 MG tablet Take 30 mg by mouth at bedtime.    Historical Provider, MD  naproxen sodium (ANAPROX) 220 MG tablet Take 220 mg by mouth daily as needed (for pain).    Historical Provider, MD  nitroGLYCERIN (NITROLINGUAL) 0.4 MG/SPRAY spray Place 1 spray under the tongue every 5 (five) minutes as needed for chest pain.    Historical Provider, MD  Physical Exam: Filed Vitals:   02/04/14 1116 02/04/14 1308 02/04/14 1310 02/04/14 1312  BP: 188/83 206/79 192/82 145/96  Pulse: 52 53 58 63  Temp: 98.2 F (36.8 C)     TempSrc: Oral     Resp: 16     SpO2: 96%       General:  No apparent distress  Eyes: PERRL, EOMI, no scleral icterus  ENT: moist oropharynx  Neck: supple, no JVD  Cardiovascular: regular rate without MRG; 2+ peripheral pulses  Respiratory: CTA biL, good air movement without wheezing, rhonchi or crackled  Abdomen: soft, non tender to palpation, positive bowel sounds, no guarding, no rebound  Skin: no rashes  Musculoskeletal: no peripheral  edema  Psychiatric: somewhat flat  Neurologic: non focal  Labs on Admission:  Basic Metabolic Panel:  Recent Labs Lab 02/04/14 1135  NA 144  K 3.7  CL 108  CO2 22  GLUCOSE 91  BUN 17  CREATININE 1.36*  CALCIUM 9.4   Liver Function Tests:  Recent Labs Lab 02/04/14 1135  AST 17  ALT 7  ALKPHOS 67  BILITOT 0.3  PROT 6.2  ALBUMIN 3.4*   No results found for this basename: LIPASE, AMYLASE,  in the last 168 hours No results found for this basename: AMMONIA,  in the last 168 hours CBC:  Recent Labs Lab 02/04/14 1135  WBC 6.9  HGB 11.6*  HCT 35.4*  MCV 82.5  PLT 153   Cardiac Enzymes: No results found for this basename: CKTOTAL, CKMB, CKMBINDEX, TROPONINI,  in the last 168 hours  BNP (last 3 results) No results found for this basename: PROBNP,  in the last 8760 hours CBG:  Recent Labs Lab 02/04/14 1149  GLUCAP 86    Radiological Exams on Admission: Dg Chest 2 View  02/04/2014   CLINICAL DATA:  73 year old female with near syncope. Initial encounter.  EXAM: CHEST  2 VIEW  COMPARISON:  05/08/2012.  FINDINGS: Portable AP and lateral semi upright view at 1154 hr. Lower lung volumes. Stable cardiomegaly and mediastinal contours. Visualized tracheal air column is within normal limits. No pneumothorax, pulmonary edema, pleural effusion or confluent pulmonary opacity. No acute osseous abnormality identified.  IMPRESSION: Lower lung volumes, otherwise no acute cardiopulmonary abnormality.   Electronically Signed   By: Augusto Gamble M.D.   On: 02/04/2014 12:04    EKG: Independently reviewed.  Assessment/Plan Active Problems:   Syncope   CKD (chronic kidney disease) stage 3, GFR 30-59 ml/min   HTN (hypertension)   CAD (coronary artery disease)    Syncope - will admit under observation to telemetry, cycle CEs. Obtain 2D echo.  CKD - creatinine at baseline. Gentle hydration, repeat BMP in am.  CAD - continue home medications HTN - restart home medications, hold  Atenolol as heart rate 50s in the ED. Hydralazine prn, may need additional oral agents.    Diet: heart healthy Fluids: NS DVT Prophylaxis: heparin  Code Status: Full  Family Communication: none  Disposition Plan: obs  Time spent: 50  This note has been created with Education officer, environmental. Any transcriptional errors are unintentional.   Helina Hullum M. Elvera Lennox, MD [facility] Pager [phone]  If 7PM-7AM, please contact night-coverage [email or web address] Password [email or web address] 02/04/2014, 2:12 PM

## 2014-02-04 NOTE — ED Notes (Signed)
Per EMS [facility], when she felt hot and weak and thought she will pass out. EMS reports pt stated similar episode few years ago, unsure if it was cardiac related. 12 lead en route unremarkable. Pt reports generalized weakness, denies pain.

## 2014-02-05 DIAGNOSIS — R55 Syncope and collapse: Secondary | ICD-10-CM

## 2014-02-05 DIAGNOSIS — N183 Chronic kidney disease, stage 3 unspecified: Secondary | ICD-10-CM

## 2014-02-05 DIAGNOSIS — I369 Nonrheumatic tricuspid valve disorder, unspecified: Secondary | ICD-10-CM

## 2014-02-05 DIAGNOSIS — I2510 Atherosclerotic heart disease of native coronary artery without angina pectoris: Secondary | ICD-10-CM

## 2014-02-05 DIAGNOSIS — I10 Essential (primary) hypertension: Secondary | ICD-10-CM

## 2014-02-05 LAB — BASIC METABOLIC PANEL
BUN: 25 mg/dL — ABNORMAL HIGH (ref 6–23)
CHLORIDE: 106 meq/L (ref 96–112)
CO2: 25 mEq/L (ref 19–32)
CREATININE: 1.72 mg/dL — AB (ref 0.50–1.10)
Calcium: 8.9 mg/dL (ref 8.4–10.5)
GFR, EST AFRICAN AMERICAN: 33 mL/min — AB (ref >90)
GFR, EST NON AFRICAN AMERICAN: 29 mL/min — AB (ref >90)
Glucose, Bld: 75 mg/dL (ref 70–99)
Potassium: 4.2 mEq/L (ref 3.7–5.3)
Sodium: 140 mEq/L (ref 137–147)

## 2014-02-05 LAB — CBC
HEMATOCRIT: 33.1 % — AB (ref 36.0–46.0)
Hemoglobin: 11 g/dL — ABNORMAL LOW (ref 12.0–15.0)
MCH: 27.2 pg (ref 26.0–34.0)
MCHC: 33.2 g/dL (ref 30.0–36.0)
MCV: 81.7 fL (ref 78.0–100.0)
Platelets: 136 10*3/uL — ABNORMAL LOW (ref 150–400)
RBC: 4.05 MIL/uL (ref 3.87–5.11)
RDW: 16.2 % — AB (ref 11.5–15.5)
WBC: 8.1 10*3/uL (ref 4.0–10.5)

## 2014-02-05 LAB — TROPONIN I: Troponin I: <0.3 ng/mL (ref <0.30)

## 2014-02-05 MED ORDER — ISOSORBIDE MONONITRATE ER 60 MG PO TB24
60.0000 mg | ORAL_TABLET | Freq: Every day | ORAL | Status: DC
  Administered 2014-02-05: 60 mg via ORAL
  Filled 2014-02-05 (×3): qty 1

## 2014-02-05 MED ORDER — SODIUM CHLORIDE 0.9 % IV SOLN
INTRAVENOUS | Status: DC
  Administered 2014-02-05 – 2014-02-06 (×2): via INTRAVENOUS

## 2014-02-05 MED ORDER — AMLODIPINE BESYLATE 10 MG PO TABS
10.0000 mg | ORAL_TABLET | Freq: Every day | ORAL | Status: DC
  Administered 2014-02-05: 10 mg via ORAL
  Filled 2014-02-05 (×2): qty 1

## 2014-02-05 NOTE — Progress Notes (Addendum)
[facility] PROGRESS NOTE  [name] [id number] DOB: 07/03/1941 DOA: 02/04/2014 PCP: [name]  Assessment/Plan: Syncope -during yoga yesterday -check orthostatics, BP in 200s -EKG with LVH and repol abnormality -cardiac enzymes negative -tele: mild sinus brady 50-60s -check ECHO  CKD - creatinine slightly worse, IVF, repeat BMP in am.   CAD - continue home medications   HTN  -uncontrolled, BP in 224/87 this am - hold Atenolol as heart rate 50s, continue imdur-increase dose, continue losartan-cautiously, add amlodipine  Code Status: Full Code Family Communication: none at bedside Disposition Plan: home tomorrow if stable     HPI/Subjective: Feels ok, no complaints  Objective: Filed Vitals:   02/05/14 0700  BP: 224/87  Pulse: 60  Temp:   Resp:     Intake/Output Summary (Last 24 hours) at 02/05/14 0824 Last data filed at 02/04/14 1930  Gross per 24 hour  Intake   1240 ml  Output      0 ml  Net   1240 ml   Filed Weights   02/04/14 1512  Weight: 61 kg (134 lb 7.7 oz)    Exam:   General:  AAOx3  Cardiovascular: S1s2/RRR  Respiratory: CTAB  Abdomen: soft, Nt, BS present  Musculoskeletal: no edema c/c   Data Reviewed: Basic Metabolic Panel:  Recent Labs Lab 02/04/14 1135 02/05/14 0237  NA 144 140  K 3.7 4.2  CL 108 106  CO2 22 25  GLUCOSE 91 75  BUN 17 25*  CREATININE 1.36* 1.72*  CALCIUM 9.4 8.9   Liver Function Tests:  Recent Labs Lab 02/04/14 1135  AST 17  ALT 7  ALKPHOS 67  BILITOT 0.3  PROT 6.2  ALBUMIN 3.4*   No results found for this basename: LIPASE, AMYLASE,  in the last 168 hours No results found for this basename: AMMONIA,  in the last 168 hours CBC:  Recent Labs Lab 02/04/14 1135 02/05/14 0237  WBC 6.9 8.1  HGB 11.6* 11.0*  HCT 35.4* 33.1*  MCV 82.5 81.7  PLT 153 136*   Cardiac Enzymes:  Recent Labs Lab 02/04/14 1551 02/04/14 2020 02/05/14 0237  TROPONINI <0.30 <0.30  <0.30   BNP (last 3 results) No results found for this basename: PROBNP,  in the last 8760 hours CBG:  Recent Labs Lab 02/04/14 1149  GLUCAP 86    No results found for this or any previous visit (from the past 240 hour(s)).   Studies: Dg Chest 2 View  02/04/2014   CLINICAL DATA:  73 year old female with near syncope. Initial encounter.  EXAM: CHEST  2 VIEW  COMPARISON:  05/08/2012.  FINDINGS: Portable AP and lateral semi upright view at 1154 hr. Lower lung volumes. Stable cardiomegaly and mediastinal contours. Visualized tracheal air column is within normal limits. No pneumothorax, pulmonary edema, pleural effusion or confluent pulmonary opacity. No acute osseous abnormality identified.  IMPRESSION: Lower lung volumes, otherwise no acute cardiopulmonary abnormality.   Electronically Signed   By: Augusto GambleLee  Hall M.D.   On: 02/04/2014 12:04    Scheduled Meds: . amLODipine  10 mg Oral Daily  . aspirin  81 mg Oral BID  . heparin  5,000 Units Subcutaneous 3 times per day  . isosorbide mononitrate  60 mg Oral Daily  . losartan  75 mg Oral Daily  . mirtazapine  30 mg Oral QHS   Continuous Infusions:  Antibiotics Given (last 72 hours)   None      Active Problems:   Syncope   CKD (chronic  kidney disease) stage 3, GFR 30-59 ml/min   HTN (hypertension)   CAD (coronary artery disease)    Time spent:    Zannie Cove  [facility] Pager [phone]. If 7PM-7AM, please contact night-coverage at [email or web address], password [email or web address] 02/05/2014, 8:24 AM  LOS: 1 day

## 2014-02-05 NOTE — Progress Notes (Signed)
UR completed 

## 2014-02-05 NOTE — Progress Notes (Signed)
Bilateral carotid artery duplex:  1-39% ICA stenosis.  Vertebral artery flow is antegrade.     

## 2014-02-05 NOTE — Care Management Note (Signed)
    Page 1 of 1   02/05/2014     12:54:31 PM   CARE MANAGEMENT NOTE 02/05/2014  Patient:  [name],[name]   Account Number:  [id number]  Date Initiated:  02/05/2014  Documentation initiated by:  Lanier ClamMAHABIR,Itha Kroeker  Subjective/Objective Assessment:   [age] Y/O F ADMITTED W/SYNCOPE.     Action/Plan:   FROM HOME.HAS PCP,PHARMACY.   Anticipated DC Date:  02/05/2014   Anticipated DC Plan:  HOME/SELF CARE      DC Planning Services  CM consult      Choice offered to / List presented to:             Status of service:  In process, will continue to follow Medicare Important Message given?   (If response is "NO", the following Medicare IM given date fields will be blank) Date Medicare IM given:   Date Additional Medicare IM given:    Discharge Disposition:    Per UR Regulation:  Reviewed for med. necessity/level of care/duration of stay  If discussed at Long Length of Stay Meetings, dates discussed:    Comments:  02/05/14 Keandria Berrocal RN,BSN NCM [phone] NO ANTICIPATED D/C NEEDS.

## 2014-02-05 NOTE — Progress Notes (Signed)
Echocardiogram 2D Echocardiogram has been performed.  Genene ChurnJames M Anyelina Claycomb 02/05/2014, 3:00 PM

## 2014-02-06 LAB — BASIC METABOLIC PANEL
BUN: 21 mg/dL (ref 6–23)
CO2: 22 mEq/L (ref 19–32)
Calcium: 9.2 mg/dL (ref 8.4–10.5)
Chloride: 109 mEq/L (ref 96–112)
Creatinine, Ser: 1.43 mg/dL — ABNORMAL HIGH (ref 0.50–1.10)
GFR, EST AFRICAN AMERICAN: 42 mL/min — AB (ref >90)
GFR, EST NON AFRICAN AMERICAN: 36 mL/min — AB (ref >90)
Glucose, Bld: 80 mg/dL (ref 70–99)
POTASSIUM: 4.1 meq/L (ref 3.7–5.3)
Sodium: 144 mEq/L (ref 137–147)

## 2014-02-06 MED ORDER — ATENOLOL 50 MG PO TABS: 50.0000 mg | ORAL_TABLET | Freq: Every evening | ORAL | Status: DC

## 2014-02-06 MED ORDER — ISOSORBIDE MONONITRATE ER 60 MG PO TB24: 60.0000 mg | ORAL_TABLET | Freq: Every day | ORAL | Status: AC

## 2014-02-06 MED ORDER — AMLODIPINE BESYLATE 10 MG PO TABS: 10.0000 mg | ORAL_TABLET | Freq: Every day | ORAL | Status: DC

## 2014-03-03 NOTE — Discharge Summary (Signed)
Physician Discharge Summary  [name]:[id number] DOB: 1941/07/21 DOA: 02/04/2014  PCP: Lorenda Peck, MD  Admit date: 02/04/2014 Discharge date: 02/05/2014  Time spent: 40 minutes  Recommendations for Outpatient Follow-up:  1. PCP in 1 week  Discharge Diagnoses:  Active Problems:   Syncope   CKD (chronic kidney disease) stage 3, GFR 30-59 ml/min   HTN (hypertension)   CAD (coronary artery disease)   Discharge Condition: stable  Diet recommendation: low sodium  Filed Weights   02/04/14 1512  Weight: 61 kg (134 lb 7.7 oz)    History of present illness:  [name] is a 73 y.o. female has a past medical history significant for CAD s/p cath and DES in 2010, recurrent syncopal episodes in the past but none recently, HTN, presents to the ED after a syncopal episode at [facility] while undergoing her first yoga class. She denies chest pain, palpitations, denies lightheadedness or dizziness, but feels weak overall. Has no GI or GU symptoms and is otherwise asymptomatic now. There is currently some confusion about recent changes in her medications, as she is telling me that one of her medications has been recently increased, but she is not sure which one. This [facility]. Patient tells me that she did not had a chance to get the new dose however mentioned to the ED RN that she did. Based on previous ED visits here (most recent one July 2014), she does not appear that she was on Atenolol in the past but patient started that she has been taking that for a "long" time, unsure how long.    Hospital Course:  Syncope -during yoga, day of admission  -orthostatics negative -EKG with LVH and repol abnormality  -cardiac enzymes negative  -tele: mild sinus brady 50-60s  -2 D ECHO with normal EF and wall motion -advised to FU with PCP in 1 week, will need cards eval if this recurs  CKD -  -improved back to baseline with hydration  CAD - continue home medications    HTN  -uncontrolled, BP in 224/87 morning of admission - hold Atenolol as heart rate 50s, continue imdur-increase dose, continue losartan-cautiously, added amlodipine  ECHO: Study Conclusions - Left ventricle: Wall thickness was increased in a pattern of moderate LVH. There was focal basal hypertrophy. Systolic function was normal. The estimated ejection fraction was in the range of 60% to 65%. Wall motion was normal; there were no regional wall motion abnormalities. There was an increased relative contribution of atrial contraction to ventricular filling. - Aortic valve: Mild regurgitation. - Mitral valve: Mild regurgitation. - Left atrium: The atrium was mildly dilated. - Tricuspid valve: Moderate regurgitation. - Pulmonary arteries: PA peak pressure: 32mm Hg (S).    Discharge Exam: Filed Vitals:   02/06/14 0417  BP: 163/83  Pulse: 77  Temp: 98.2 F (36.8 C)  Resp: 20    General: AAOx3 Cardiovascular: S1S2/RRR Respiratory: CTAB  Discharge Instructions You were cared for by a hospitalist during your hospital stay. If you have any questions about your discharge medications or the care you received while you were in the hospital after you are discharged, you can call the unit and asked to speak with the hospitalist on call if the hospitalist that took care of you is not available. Once you are discharged, your primary care physician will handle any further medical issues. Please note that NO REFILLS for any discharge medications will be authorized once you are discharged, as it is  imperative that you return to your primary care physician (or establish a relationship with a primary care physician if you do not have one) for your aftercare needs so that they can reassess your need for medications and monitor your lab values.  Discharge Orders   Future Orders Complete By Expires   Diet - low sodium heart healthy  As directed    Increase activity slowly  As directed         Medication List    STOP taking these medications       naproxen sodium 220 MG tablet  Commonly known as:  ANAPROX      TAKE these medications       acetaminophen 500 MG tablet  Commonly known as:  TYLENOL  Take 500 mg by mouth every 6 (six) hours as needed for pain.     alum & mag hydroxide-simeth 400-400-40 MG/5ML suspension  Commonly known as:  MAALOX PLUS  Take 5 mLs by mouth every 6 (six) hours as needed for indigestion.     amLODipine 10 MG tablet  Commonly known as:  NORVASC  Take 1 tablet (10 mg total) by mouth daily.     aspirin 81 MG tablet  Take 81 mg by mouth 2 (two) times daily.     atenolol 50 MG tablet  Commonly known as:  TENORMIN  Take 1 tablet (50 mg total) by mouth every evening.     isosorbide mononitrate 60 MG 24 hr tablet  Commonly known as:  IMDUR  Take 1 tablet (60 mg total) by mouth daily.     lidocaine 2 % solution  Commonly known as:  XYLOCAINE  Take 5 mLs by mouth 4 (four) times daily as needed for pain.     losartan 50 MG tablet  Commonly known as:  COZAAR  Take 1.5 tablets (75 mg total) by mouth daily.     mirtazapine 30 MG tablet  Commonly known as:  REMERON  Take 30 mg by mouth at bedtime.     nitroGLYCERIN 0.4 MG/SPRAY spray  Commonly known as:  NITROLINGUAL  Place 1 spray under the tongue every 5 (five) minutes as needed for chest pain.     omeprazole 20 MG capsule  Commonly known as:  PRILOSEC  Take 20 mg by mouth daily.     VITAMIN D-3 PO  Take 1 tablet by mouth daily.       Allergies  Allergen Reactions  . Paxil [Paroxetine Hcl] Other (See Comments)    unknown       Follow-up Information   Follow up with ROBERTS, Vernie AmmonsONALD WAYNE, MD. Schedule an appointment as soon as possible for a visit in 1 week.   Specialty:  Internal Medicine   Contact information:   [address] [place] [address] [phone]        The results of significant diagnostics from this hospitalization (including imaging,  microbiology, ancillary and laboratory) are listed below for reference.    Significant Diagnostic Studies: Dg Chest 2 View  02/04/2014   CLINICAL DATA:  73 year old female with near syncope. Initial encounter.  EXAM: CHEST  2 VIEW  COMPARISON:  05/08/2012.  FINDINGS: Portable AP and lateral semi upright view at 1154 hr. Lower lung volumes. Stable cardiomegaly and mediastinal contours. Visualized tracheal air column is within normal limits. No pneumothorax, pulmonary edema, pleural effusion or confluent pulmonary opacity. No acute osseous abnormality identified.  IMPRESSION: Lower lung volumes, otherwise no acute cardiopulmonary abnormality.   Electronically Signed  By: Augusto GambleLee  Hall M.D.   On: 02/04/2014 12:04    Microbiology: No results found for this or any previous visit (from the past 240 hour(s)).   Labs: Basic Metabolic Panel: No results found for this basename: NA, K, CL, CO2, GLUCOSE, BUN, CREATININE, CALCIUM, MG, PHOS,  in the last 168 hours Liver Function Tests: No results found for this basename: AST, ALT, ALKPHOS, BILITOT, PROT, ALBUMIN,  in the last 168 hours No results found for this basename: LIPASE, AMYLASE,  in the last 168 hours No results found for this basename: AMMONIA,  in the last 168 hours CBC: No results found for this basename: WBC, NEUTROABS, HGB, HCT, MCV, PLT,  in the last 168 hours Cardiac Enzymes: No results found for this basename: CKTOTAL, CKMB, CKMBINDEX, TROPONINI,  in the last 168 hours BNP: BNP (last 3 results) No results found for this basename: PROBNP,  in the last 8760 hours CBG: No results found for this basename: GLUCAP,  in the last 168 hours     Signed:  Zannie Covereetha Maxi Carreras  [facility] 03/03/2014, 4:35 PM

## 2015-04-25 ENCOUNTER — Ambulatory Visit
Admission: RE | Admit: 2015-04-25 | Discharge: 2015-04-25 | Disposition: A | Discharge status: Home / Self Care | Payer: Commercial Managed Care - HMO | Source: Ambulatory Visit | Attending: Internal Medicine | Admitting: Internal Medicine

## 2015-04-25 ENCOUNTER — Other Ambulatory Visit: Payer: Self-pay

## 2015-04-25 ENCOUNTER — Other Ambulatory Visit: Payer: Self-pay | Admitting: Internal Medicine

## 2015-04-25 DIAGNOSIS — R05 Cough: Secondary | ICD-10-CM

## 2015-04-25 DIAGNOSIS — R059 Cough, unspecified: Secondary | ICD-10-CM

## 2015-06-17 ENCOUNTER — Emergency Department (HOSPITAL_COMMUNITY): Payer: Medicare HMO

## 2015-06-17 ENCOUNTER — Encounter (HOSPITAL_COMMUNITY): Payer: Self-pay | Admitting: Emergency Medicine

## 2015-06-17 ENCOUNTER — Emergency Department (HOSPITAL_COMMUNITY)
Admission: EM | Admit: 2015-06-17 | Discharge: 2015-06-17 | Disposition: A | Discharge status: Home / Self Care | Payer: Medicare HMO | Attending: Emergency Medicine | Admitting: Emergency Medicine

## 2015-06-17 DIAGNOSIS — Z79899 Other long term (current) drug therapy: Secondary | ICD-10-CM | POA: Diagnosis not present

## 2015-06-17 DIAGNOSIS — R001 Bradycardia, unspecified: Secondary | ICD-10-CM | POA: Insufficient documentation

## 2015-06-17 DIAGNOSIS — I10 Essential (primary) hypertension: Secondary | ICD-10-CM

## 2015-06-17 DIAGNOSIS — R5383 Other fatigue: Secondary | ICD-10-CM | POA: Insufficient documentation

## 2015-06-17 DIAGNOSIS — N183 Chronic kidney disease, stage 3 unspecified: Secondary | ICD-10-CM

## 2015-06-17 DIAGNOSIS — Z9861 Coronary angioplasty status: Secondary | ICD-10-CM | POA: Diagnosis not present

## 2015-06-17 DIAGNOSIS — R112 Nausea with vomiting, unspecified: Secondary | ICD-10-CM | POA: Diagnosis present

## 2015-06-17 DIAGNOSIS — Z7982 Long term (current) use of aspirin: Secondary | ICD-10-CM | POA: Diagnosis not present

## 2015-06-17 DIAGNOSIS — I129 Hypertensive chronic kidney disease with stage 1 through stage 4 chronic kidney disease, or unspecified chronic kidney disease: Secondary | ICD-10-CM | POA: Insufficient documentation

## 2015-06-17 DIAGNOSIS — R531 Weakness: Secondary | ICD-10-CM | POA: Insufficient documentation

## 2015-06-17 DIAGNOSIS — Z720 Tobacco use: Secondary | ICD-10-CM | POA: Insufficient documentation

## 2015-06-17 DIAGNOSIS — Z8739 Personal history of other diseases of the musculoskeletal system and connective tissue: Secondary | ICD-10-CM | POA: Diagnosis not present

## 2015-06-17 DIAGNOSIS — R42 Dizziness and giddiness: Secondary | ICD-10-CM | POA: Insufficient documentation

## 2015-06-17 DIAGNOSIS — I2510 Atherosclerotic heart disease of native coronary artery without angina pectoris: Secondary | ICD-10-CM | POA: Diagnosis not present

## 2015-06-17 DIAGNOSIS — R110 Nausea: Secondary | ICD-10-CM

## 2015-06-17 HISTORY — DX: Atherosclerotic heart disease of native coronary artery without angina pectoris: I25.10

## 2015-06-17 LAB — CBC WITH DIFFERENTIAL/PLATELET
Basophils Absolute: 0 10*3/uL (ref 0.0–0.1)
Basophils Relative: 0 % (ref 0–1)
EOS ABS: 0.3 10*3/uL (ref 0.0–0.7)
EOS PCT: 3 % (ref 0–5)
HCT: 37.2 % (ref 36.0–46.0)
Hemoglobin: 12.2 g/dL (ref 12.0–15.0)
LYMPHS PCT: 18 % (ref 12–46)
Lymphs Abs: 1.8 10*3/uL (ref 0.7–4.0)
MCH: 28 pg (ref 26.0–34.0)
MCHC: 32.8 g/dL (ref 30.0–36.0)
MCV: 85.5 fL (ref 78.0–100.0)
MONOS PCT: 6 % (ref 3–12)
Monocytes Absolute: 0.6 10*3/uL (ref 0.1–1.0)
Neutro Abs: 7.7 10*3/uL (ref 1.7–7.7)
Neutrophils Relative %: 73 % (ref 43–77)
PLATELETS: 209 10*3/uL (ref 150–400)
RBC: 4.35 MIL/uL (ref 3.87–5.11)
RDW: 16.1 % — ABNORMAL HIGH (ref 11.5–15.5)
WBC: 10.4 10*3/uL (ref 4.0–10.5)

## 2015-06-17 LAB — URINALYSIS, ROUTINE W REFLEX MICROSCOPIC
BILIRUBIN URINE: NEGATIVE
Glucose, UA: NEGATIVE mg/dL
HGB URINE DIPSTICK: NEGATIVE
KETONES UR: NEGATIVE mg/dL
NITRITE: NEGATIVE
PH: 6.5 (ref 5.0–8.0)
Protein, ur: NEGATIVE mg/dL
Specific Gravity, Urine: 1.012 (ref 1.005–1.030)
UROBILINOGEN UA: 0.2 mg/dL (ref 0.0–1.0)

## 2015-06-17 LAB — COMPREHENSIVE METABOLIC PANEL
ALK PHOS: 54 U/L (ref 38–126)
ALT: 11 U/L — AB (ref 14–54)
AST: 19 U/L (ref 15–41)
Albumin: 3.4 g/dL — ABNORMAL LOW (ref 3.5–5.0)
Anion gap: 8 (ref 5–15)
BUN: 11 mg/dL (ref 6–20)
CALCIUM: 9.1 mg/dL (ref 8.9–10.3)
CO2: 24 mmol/L (ref 22–32)
CREATININE: 1.27 mg/dL — AB (ref 0.44–1.00)
Chloride: 111 mmol/L (ref 101–111)
GFR calc Af Amer: 47 mL/min — ABNORMAL LOW (ref >60)
GFR, EST NON AFRICAN AMERICAN: 41 mL/min — AB (ref >60)
Glucose, Bld: 95 mg/dL (ref 65–99)
Potassium: 3.7 mmol/L (ref 3.5–5.1)
SODIUM: 143 mmol/L (ref 135–145)
Total Bilirubin: 0.6 mg/dL (ref 0.3–1.2)
Total Protein: 5.9 g/dL — ABNORMAL LOW (ref 6.5–8.1)

## 2015-06-17 LAB — URINE MICROSCOPIC-ADD ON

## 2015-06-17 LAB — I-STAT TROPONIN, ED: Troponin i, poc: 0.03 ng/mL (ref 0.00–0.08)

## 2015-06-17 LAB — I-STAT CG4 LACTIC ACID, ED: Lactic Acid, Venous: 1.11 mmol/L (ref 0.5–2.0)

## 2015-06-17 MED ORDER — MECLIZINE HCL 25 MG PO TABS
25.0000 mg | ORAL_TABLET | Freq: Once | ORAL | Status: AC
  Administered 2015-06-17: 25 mg via ORAL
  Filled 2015-06-17: qty 1

## 2015-06-17 MED ORDER — SODIUM CHLORIDE 0.9 % IV SOLN
1000.0000 mL | Freq: Once | INTRAVENOUS | Status: AC
  Administered 2015-06-17: 1000 mL via INTRAVENOUS

## 2015-06-17 NOTE — ED Notes (Signed)
Hannah, PA at bedside  

## 2015-06-17 NOTE — ED Notes (Signed)
EMS: Woke up at 0900 and became dizzy, vertigo, nausea, weakness, malaise at 1100.   Negative stroke screen with ems, reports being clammy but has been warm and dry for the duration of the EMS ride.   No pain anywhere.   VS: 160/100, 50s sinus brady (takes atenolol).  A year ago she had a syncopal episode during a yoga class.  Lives by herself, normally active, mowed yard yesterday, CBG 105 but no hx of DM. Has had GI surgery in past, duodem removed.  Was given 4 mg zofran through 20 gauge placed by ems in right distal forearm with no change in nausea.

## 2015-06-17 NOTE — Discharge Instructions (Signed)
1. Medications:  usual home medications °2. Treatment: rest, drink plenty of fluids,  °3. Follow Up: Please followup with your primary doctor in 2-3 days for discussion of your diagnoses and further evaluation after today's visit; if you do not have a primary care doctor use the resource guide provided to find one; Please return to the ER for return of symptoms or other concerns. ° °

## 2015-06-17 NOTE — ED Notes (Signed)
Patient transported to CT and xray 

## 2015-06-17 NOTE — ED Notes (Signed)
No neural deficits noted during triage, grip strength equal, PERRLA, no facial droop, no slurred speech.

## 2015-06-17 NOTE — ED Provider Notes (Signed)
CSN: [id number]     Arrival date & time 06/17/15  1308 History   First MD Initiated Contact with Patient 06/17/15 1316     Chief Complaint  Patient presents with  . Nausea  . Dizziness  . Weakness     (Consider location/radiation/quality/duration/timing/severity/associated sxs/prior Treatment) Patient is a 74 y.o. female presenting with dizziness and weakness. The history is provided by the patient and medical records. No language interpreter was used.  Dizziness Associated symptoms: nausea and weakness   Associated symptoms: no chest pain, no diarrhea, no headaches, no shortness of breath and no vomiting   Weakness Associated symptoms include diaphoresis ( resolved), fatigue, nausea and weakness. Pertinent negatives include no abdominal pain, chest pain, coughing, fever, headaches, rash or vomiting.     AGRIPINA GUYETTE is a 74 y.o. female  with a hx of HTN, sciatica, CAD, CKD presents to the Emergency Department complaining of gradual, persistent, progressively worsening weakness, dizziness (room spinning), diaphoresis and nausea without vomiting onset 11am this morning.  Pt reports she was able to walk, but felt off balance and that she might pass out.  Pt reports improved nausea after treatment by EMS, but persistent weakness.  Pt denies MI; record shows CAD with stent.  Pt reports chest pain several days ago lasting but this has not returned and there was no CP or SOB today. Standing and moving made the symptoms worse.  Pt denies fever, chills, headache, neck pain, chest pain, SOB, abd pain, V/D, full syncope, dysuria.  Pt reports taking all meds today including atenolol.  Patient also reports history of duodenectomy and that has occasional nausea and vomiting. She reports she takes Phenergan for this today symptoms are different from any other episodes she's ever had.   Past Medical History  Diagnosis Date  . Hypertension   . Sciatica   . Coronary artery disease    Past  Surgical History  Procedure Laterality Date  . Coronary angioplasty with stent placement  within past 10 years  . Abdominal surgery     No family history on file. Social History  Substance Use Topics  . Smoking status: Current Every Day Smoker -- 0.75 packs/day for 53 years    Types: Cigarettes  . Smokeless tobacco: Never Used  . Alcohol Use: Yes     Comment: occasional   OB History    No data available     Review of Systems  Constitutional: Positive for diaphoresis ( resolved) and fatigue. Negative for fever, appetite change and unexpected weight change.  HENT: Negative for mouth sores.   Eyes: Negative for visual disturbance.  Respiratory: Negative for cough, chest tightness, shortness of breath and wheezing.   Cardiovascular: Negative for chest pain.  Gastrointestinal: Positive for nausea. Negative for vomiting, abdominal pain, diarrhea and constipation.  Endocrine: Negative for polydipsia, polyphagia and polyuria.  Genitourinary: Negative for dysuria, urgency, frequency and hematuria.  Musculoskeletal: Negative for back pain and neck stiffness.  Skin: Negative for rash.  Allergic/Immunologic: Negative for immunocompromised state.  Neurological: Positive for dizziness and weakness. Negative for syncope, light-headedness and headaches.  Hematological: Does not bruise/bleed easily.  Psychiatric/Behavioral: Negative for sleep disturbance. The patient is not nervous/anxious.       Allergies  Paxil  Home Medications   Prior to Admission medications   Medication Sig Start Date End Date Taking? Authorizing Provider  acetaminophen (TYLENOL) 500 MG tablet Take 500 mg by mouth every 6 (six) hours as needed for pain.    Historical Provider,  MD  alum & mag hydroxide-simeth (MAALOX PLUS) 400-400-40 MG/5ML suspension Take 5 mLs by mouth every 6 (six) hours as needed for indigestion.    Historical Provider, MD  amLODipine (NORVASC) 10 MG tablet Take 1 tablet (10 mg total) by mouth  daily. 02/06/14   Zannie Cove, MD  aspirin 81 MG tablet Take 81 mg by mouth 2 (two) times daily.    Historical Provider, MD  atenolol (TENORMIN) 50 MG tablet Take 1 tablet (50 mg total) by mouth every evening. 02/06/14   Zannie Cove, MD  Cholecalciferol (VITAMIN D-3 PO) Take 1 tablet by mouth daily.    Historical Provider, MD  isosorbide mononitrate (IMDUR) 60 MG 24 hr tablet Take 1 tablet (60 mg total) by mouth daily. 02/06/14   Zannie Cove, MD  lidocaine (XYLOCAINE) 2 % solution Take 5 mLs by mouth 4 (four) times daily as needed for pain.    Historical Provider, MD  losartan (COZAAR) 50 MG tablet Take 1.5 tablets (75 mg total) by mouth daily. 03/11/13   Runell Gess, MD  mirtazapine (REMERON) 30 MG tablet Take 30 mg by mouth at bedtime.    Historical Provider, MD  nitroGLYCERIN (NITROLINGUAL) 0.4 MG/SPRAY spray Place 1 spray under the tongue every 5 (five) minutes as needed for chest pain.    Historical Provider, MD  omeprazole (PRILOSEC) 20 MG capsule Take 20 mg by mouth daily.    Historical Provider, MD   BP 115/60 mmHg  Pulse 58  Temp(Src) 98.2 F (36.8 C) (Oral)  Resp 20  Ht  (1.702 m)  Wt 140 lb (63.504 kg)  BMI 21.92 kg/m2  SpO2 96% Physical Exam  Constitutional: She is oriented to person, place, and time. She appears well-developed and well-nourished. No distress.  Awake, alert, nontoxic appearance  HENT:  Head: Normocephalic and atraumatic.  Mouth/Throat: Oropharynx is clear and moist. No oropharyngeal exudate.  Eyes: Conjunctivae and EOM are normal. Pupils are equal, round, and reactive to light. No scleral icterus.  No horizontal, vertical or rotational nystagmus  Neck: Normal range of motion. Neck supple.  Full active and passive ROM without pain No midline or paraspinal tenderness No nuchal rigidity or meningeal signs  Cardiovascular: Regular rhythm and intact distal pulses.  Bradycardia present.   Pulses:      Radial pulses are 2+ on the right side, and  2+ on the left side.  Pulmonary/Chest: Effort normal and breath sounds normal. No respiratory distress. She has no wheezes. She has no rales.  Equal chest expansion  Abdominal: Soft. Bowel sounds are normal. She exhibits no mass. There is no tenderness. There is no rebound and no guarding.  Musculoskeletal: Normal range of motion. She exhibits no edema.  Lymphadenopathy:    She has no cervical adenopathy.  Neurological: She is alert and oriented to person, place, and time. She has normal reflexes. No cranial nerve deficit. She exhibits normal muscle tone. Coordination normal.  Mental Status:  Alert, oriented, thought content appropriate. Speech fluent without evidence of aphasia. Able to follow 2 step commands without difficulty.  Cranial Nerves:  II:  Peripheral visual fields grossly normal, pupils equal, round, reactive to light III,IV, VI: ptosis not present, extra-ocular motions intact bilaterally  V,VII: smile symmetric, facial light touch sensation equal VIII: hearing grossly normal bilaterally  IX,X: midline uvula rise  XI: bilateral shoulder shrug equal and strong XII: midline tongue extension  Motor:  5/5 in upper and lower extremities bilaterally including strong and equal grip strength and dorsiflexion/plantar  flexion Sensory: Pinprick and light touch normal in all extremities.  Deep Tendon Reflexes: 2+ and symmetric  Cerebellar: normal finger-to-nose with bilateral upper extremities Gait: gait testing deferred due to weakness CV: distal pulses palpable throughout   Skin: Skin is warm and dry. No rash noted. She is not diaphoretic.  Psychiatric: She has a normal mood and affect. Her behavior is normal. Judgment and thought content normal.  Nursing note and vitals reviewed.   ED Course  Procedures (including critical care time) Labs Review Labs Reviewed  CBC WITH DIFFERENTIAL/PLATELET - Abnormal; Notable for the following:    RDW 16.1 (*)    All other components within  normal limits  COMPREHENSIVE METABOLIC PANEL - Abnormal; Notable for the following:    Creatinine, Ser 1.27 (*)    Total Protein 5.9 (*)    Albumin 3.4 (*)    ALT 11 (*)    GFR calc non Af Amer 41 (*)    GFR calc Af Amer 47 (*)    All other components within normal limits  URINALYSIS, ROUTINE W REFLEX MICROSCOPIC (NOT AT [facility]) - Abnormal; Notable for the following:    Leukocytes, UA TRACE (*)    All other components within normal limits  URINE MICROSCOPIC-ADD ON - Abnormal; Notable for the following:    Squamous Epithelial / LPF FEW (*)    Bacteria, UA FEW (*)    All other components within normal limits  I-STAT CG4 LACTIC ACID, ED  I-STAT TROPOININ, ED  CBG MONITORING, ED    Imaging Review Dg Chest 2 View  06/17/2015   CLINICAL DATA:  Acute onset dizziness and nausea today. Coronary artery disease. Chronic kidney disease. Hypertension.  EXAM: CHEST  2 VIEW  COMPARISON:  04/25/2015  FINDINGS: The heart size and mediastinal contours are within normal limits. Both lungs are clear. The visualized skeletal structures are unremarkable.  IMPRESSION: No active cardiopulmonary disease.   Electronically Signed   By: Myles Rosenthal M.D.   On: 06/17/2015 14:14   Ct Head Wo Contrast  06/17/2015   CLINICAL DATA:  Sudden onset of dizziness and weakness.  EXAM: CT HEAD WITHOUT CONTRAST  TECHNIQUE: Contiguous axial images were obtained from the base of the skull through the vertex without contrast.  COMPARISON:  05/08/2012  FINDINGS: Again noted is diffuse low density throughout the white matter. No evidence for acute hemorrhage, mass lesion, midline shift, hydrocephalus or large infarct. Evidence for a cavum septum pellucidum. Visualized mastoid air cells are clear. Visualized paranasal sinuses are clear. No acute bone abnormality. Stable low-density area in the left caudate head may represent old lacune injury.  IMPRESSION: No acute intracranial abnormality.  Stable diffuse white matter disease. Findings  are nonspecific but could represent chronic small vessel ischemic changes.   Electronically Signed   By: Richarda Overlie M.D.   On: 06/17/2015 14:47   I have personally reviewed and evaluated these images and lab results as part of my medical decision-making.   EKG Interpretation   Date/Time:  Friday June 17 2015 13:13:30 EDT Ventricular Rate:  52 PR Interval:  182 QRS Duration: 87 QT Interval:  480 QTC Calculation: 446 R Axis:   -10 Text Interpretation:  Sinus rhythm No significant change since last  tracing Confirmed by KOHUT  MD, STEPHEN ([id number]) on 06/17/2015 1:45:34 PM      MDM   Final diagnoses:  Dizziness  CKD (chronic kidney disease) stage 3, GFR 30-59 ml/min  Essential hypertension  Nausea  Lightheadedness   Eber Jones  L Chao presents with complaints of dizziness, nausea and weakness beginning at 11:30 today. Symptoms are worse with movement. She has no chest pain, headache, visual changes. She is a history of syncopal episode however did not have a syncopal episode today.  She has a history of hypertension and is found to be hypertensive in the emergency department however she reports she did take her morning medications.  Will check labs, UA and obtain CT head.  3:44 PM Patient reports she is feeling much better and reports complete resolution of her symptoms. Urinalysis without evidence of urinary tract infection, troponin negative, ECG nonischemic, no leukocytosis or anemia.  No electrolyte disturbance. Mild elevation in serum creatinine to 1.27. CT head without acute abnormalities only diffuse white matter disease.  Chest x-ray without pneumonia, pneumothorax or pulmonary edema.  Orthostatic VS for the past 24 hrs:  BP- Lying Pulse- Lying BP- Sitting Pulse- Sitting BP- Standing at 0 minutes Pulse- Standing at 0 minutes  06/17/15 1538 (!) 131/97 mmHg 61 115/60 mmHg 58 113/63 mmHg 57    Patient is without orthostatic vital signs. She has no return of her dizziness nausea or  near syncope with ambulation or with orthostatic vital signs.  She requests for discharge home. She is well-appearing is tolerating by mouth and feels better. I believe she is safe for discharge home.    The patient was discussed with and seen by Dr. Juleen China who agrees with the treatment plan.   Dahlia Client Shelvy Perazzo, PA-C 06/17/15 1556  Raeford Razor, MD 06/22/15 1351

## 2015-11-11 ENCOUNTER — Inpatient Hospital Stay (HOSPITAL_COMMUNITY)
Admission: EM | Admit: 2015-11-11 | Discharge: 2015-11-13 | DRG: 371 | Disposition: A | Discharge status: Home / Self Care | Payer: Medicare HMO | Attending: Internal Medicine | Admitting: Internal Medicine

## 2015-11-11 ENCOUNTER — Emergency Department (HOSPITAL_COMMUNITY): Payer: Medicare HMO

## 2015-11-11 ENCOUNTER — Encounter (HOSPITAL_COMMUNITY): Payer: Self-pay | Admitting: Emergency Medicine

## 2015-11-11 DIAGNOSIS — R109 Unspecified abdominal pain: Secondary | ICD-10-CM

## 2015-11-11 DIAGNOSIS — R1031 Right lower quadrant pain: Secondary | ICD-10-CM

## 2015-11-11 DIAGNOSIS — F17210 Nicotine dependence, cigarettes, uncomplicated: Secondary | ICD-10-CM | POA: Diagnosis present

## 2015-11-11 DIAGNOSIS — E43 Unspecified severe protein-calorie malnutrition: Secondary | ICD-10-CM | POA: Diagnosis present

## 2015-11-11 DIAGNOSIS — K625 Hemorrhage of anus and rectum: Secondary | ICD-10-CM | POA: Diagnosis present

## 2015-11-11 DIAGNOSIS — E278 Other specified disorders of adrenal gland: Secondary | ICD-10-CM | POA: Diagnosis present

## 2015-11-11 DIAGNOSIS — I2510 Atherosclerotic heart disease of native coronary artery without angina pectoris: Secondary | ICD-10-CM | POA: Diagnosis present

## 2015-11-11 DIAGNOSIS — I129 Hypertensive chronic kidney disease with stage 1 through stage 4 chronic kidney disease, or unspecified chronic kidney disease: Secondary | ICD-10-CM | POA: Diagnosis present

## 2015-11-11 DIAGNOSIS — Z888 Allergy status to other drugs, medicaments and biological substances status: Secondary | ICD-10-CM | POA: Diagnosis not present

## 2015-11-11 DIAGNOSIS — A049 Bacterial intestinal infection, unspecified: Principal | ICD-10-CM | POA: Diagnosis present

## 2015-11-11 DIAGNOSIS — N179 Acute kidney failure, unspecified: Secondary | ICD-10-CM | POA: Diagnosis present

## 2015-11-11 DIAGNOSIS — K37 Unspecified appendicitis: Secondary | ICD-10-CM | POA: Diagnosis present

## 2015-11-11 DIAGNOSIS — R197 Diarrhea, unspecified: Secondary | ICD-10-CM

## 2015-11-11 DIAGNOSIS — K5730 Diverticulosis of large intestine without perforation or abscess without bleeding: Secondary | ICD-10-CM | POA: Diagnosis present

## 2015-11-11 DIAGNOSIS — N183 Chronic kidney disease, stage 3 unspecified: Secondary | ICD-10-CM | POA: Diagnosis present

## 2015-11-11 DIAGNOSIS — Z7982 Long term (current) use of aspirin: Secondary | ICD-10-CM | POA: Diagnosis not present

## 2015-11-11 DIAGNOSIS — Z955 Presence of coronary angioplasty implant and graft: Secondary | ICD-10-CM | POA: Diagnosis not present

## 2015-11-11 DIAGNOSIS — A084 Viral intestinal infection, unspecified: Secondary | ICD-10-CM | POA: Diagnosis present

## 2015-11-11 DIAGNOSIS — I10 Essential (primary) hypertension: Secondary | ICD-10-CM | POA: Diagnosis present

## 2015-11-11 DIAGNOSIS — N19 Unspecified kidney failure: Secondary | ICD-10-CM | POA: Diagnosis present

## 2015-11-11 DIAGNOSIS — D696 Thrombocytopenia, unspecified: Secondary | ICD-10-CM | POA: Diagnosis present

## 2015-11-11 HISTORY — DX: Chronic kidney disease, stage 3 (moderate): N18.3

## 2015-11-11 HISTORY — DX: Chronic kidney disease, stage 3 unspecified: N18.30

## 2015-11-11 LAB — URINALYSIS, ROUTINE W REFLEX MICROSCOPIC
GLUCOSE, UA: NEGATIVE mg/dL
Ketones, ur: 15 mg/dL — AB
Leukocytes, UA: NEGATIVE
Nitrite: NEGATIVE
PH: 5 (ref 5.0–8.0)
Protein, ur: NEGATIVE mg/dL
SPECIFIC GRAVITY, URINE: 1.026 (ref 1.005–1.030)

## 2015-11-11 LAB — CBC
HCT: 39.1 % (ref 36.0–46.0)
HEMATOCRIT: 41.7 % (ref 36.0–46.0)
HEMOGLOBIN: 14.2 g/dL (ref 12.0–15.0)
HEMOGLOBIN: 13.3 g/dL (ref 12.0–15.0)
MCH: 28.7 pg (ref 26.0–34.0)
MCH: 29.1 pg (ref 26.0–34.0)
MCHC: 34.1 g/dL (ref 30.0–36.0)
MCHC: 34 g/dL (ref 30.0–36.0)
MCV: 84.2 fL (ref 78.0–100.0)
MCV: 85.6 fL (ref 78.0–100.0)
Platelets: 159 10*3/uL (ref 150–400)
Platelets: 147 10*3/uL — ABNORMAL LOW (ref 150–400)
RBC: 4.95 MIL/uL (ref 3.87–5.11)
RBC: 4.57 MIL/uL (ref 3.87–5.11)
RDW: 15.2 % (ref 11.5–15.5)
RDW: 15.4 % (ref 11.5–15.5)
WBC: 9.3 10*3/uL (ref 4.0–10.5)
WBC: 12.1 10*3/uL — ABNORMAL HIGH (ref 4.0–10.5)

## 2015-11-11 LAB — CREATININE, SERUM
Creatinine, Ser: 1.59 mg/dL — ABNORMAL HIGH (ref 0.44–1.00)
GFR calc Af Amer: 36 mL/min — ABNORMAL LOW (ref >60)
GFR calc non Af Amer: 31 mL/min — ABNORMAL LOW (ref >60)

## 2015-11-11 LAB — COMPREHENSIVE METABOLIC PANEL
ALBUMIN: 4.2 g/dL (ref 3.5–5.0)
ALT: 19 U/L (ref 14–54)
ANION GAP: 13 (ref 5–15)
AST: 38 U/L (ref 15–41)
Alkaline Phosphatase: 59 U/L (ref 38–126)
BUN: 41 mg/dL — ABNORMAL HIGH (ref 6–20)
CHLORIDE: 107 mmol/L (ref 101–111)
CO2: 23 mmol/L (ref 22–32)
Calcium: 10 mg/dL (ref 8.9–10.3)
Creatinine, Ser: 2.01 mg/dL — ABNORMAL HIGH (ref 0.44–1.00)
GFR calc non Af Amer: 23 mL/min — ABNORMAL LOW (ref >60)
GFR, EST AFRICAN AMERICAN: 27 mL/min — AB (ref >60)
Glucose, Bld: 77 mg/dL (ref 65–99)
Potassium: 4.1 mmol/L (ref 3.5–5.1)
SODIUM: 143 mmol/L (ref 135–145)
Total Bilirubin: 0.9 mg/dL (ref 0.3–1.2)
Total Protein: 7 g/dL (ref 6.5–8.1)

## 2015-11-11 LAB — URINE MICROSCOPIC-ADD ON

## 2015-11-11 LAB — CBG MONITORING, ED: GLUCOSE-CAPILLARY: 70 mg/dL (ref 65–99)

## 2015-11-11 LAB — LIPASE, BLOOD: LIPASE: 19 U/L (ref 11–51)

## 2015-11-11 MED ORDER — FENTANYL CITRATE (PF) 100 MCG/2ML IJ SOLN: 12.5000 ug | INTRAMUSCULAR | Status: DC | PRN

## 2015-11-11 MED ORDER — SODIUM CHLORIDE 0.9 % IV SOLN
Freq: Once | INTRAVENOUS | Status: AC
  Administered 2015-11-11: 15:00:00 via INTRAVENOUS

## 2015-11-11 MED ORDER — ASPIRIN EC 81 MG PO TBEC
81.0000 mg | DELAYED_RELEASE_TABLET | Freq: Every day | ORAL | Status: DC
  Administered 2015-11-12 – 2015-11-13 (×2): 81 mg via ORAL
  Filled 2015-11-11 (×2): qty 1

## 2015-11-11 MED ORDER — HYDROCODONE-ACETAMINOPHEN 5-325 MG PO TABS
1.0000 | ORAL_TABLET | ORAL | Status: DC | PRN
  Filled 2015-11-11: qty 2

## 2015-11-11 MED ORDER — ATENOLOL 50 MG PO TABS
100.0000 mg | ORAL_TABLET | Freq: Every day | ORAL | Status: DC
  Administered 2015-11-11 – 2015-11-13 (×3): 100 mg via ORAL
  Filled 2015-11-11: qty 2
  Filled 2015-11-11: qty 4
  Filled 2015-11-11: qty 2

## 2015-11-11 MED ORDER — ONDANSETRON HCL 4 MG/2ML IJ SOLN
4.0000 mg | Freq: Four times a day (QID) | INTRAMUSCULAR | Status: DC | PRN
Start: 2015-11-11 — End: 2015-11-13
  Administered 2015-11-11: 4 mg via INTRAVENOUS
  Filled 2015-11-11: qty 2

## 2015-11-11 MED ORDER — SODIUM CHLORIDE 0.9 % IV BOLUS (SEPSIS)
1000.0000 mL | Freq: Once | INTRAVENOUS | Status: AC
  Administered 2015-11-11: 1000 mL via INTRAVENOUS

## 2015-11-11 MED ORDER — IOHEXOL 300 MG/ML  SOLN: 25.0000 mL | INTRAMUSCULAR | Status: DC

## 2015-11-11 MED ORDER — PIPERACILLIN-TAZOBACTAM 3.375 G IVPB 30 MIN
3.3750 g | Freq: Once | INTRAVENOUS | Status: AC
  Administered 2015-11-11: 3.375 g via INTRAVENOUS
  Filled 2015-11-11: qty 50

## 2015-11-11 MED ORDER — ONDANSETRON HCL 4 MG/2ML IJ SOLN
4.0000 mg | Freq: Once | INTRAMUSCULAR | Status: AC | PRN
  Administered 2015-11-11: 4 mg via INTRAVENOUS
  Filled 2015-11-11: qty 2

## 2015-11-11 MED ORDER — HYDRALAZINE HCL 20 MG/ML IJ SOLN
5.0000 mg | Freq: Once | INTRAMUSCULAR | Status: AC
Start: 2015-11-11 — End: 2015-11-11
  Administered 2015-11-11: 5 mg via INTRAVENOUS
  Filled 2015-11-11: qty 1

## 2015-11-11 MED ORDER — SIMVASTATIN 20 MG PO TABS
20.0000 mg | ORAL_TABLET | Freq: Every day | ORAL | Status: DC
  Administered 2015-11-12 – 2015-11-13 (×2): 20 mg via ORAL
  Filled 2015-11-11 (×2): qty 1

## 2015-11-11 MED ORDER — PIPERACILLIN-TAZOBACTAM 3.375 G IVPB
3.3750 g | Freq: Three times a day (TID) | INTRAVENOUS | Status: DC
  Administered 2015-11-11 – 2015-11-12 (×3): 3.375 g via INTRAVENOUS
  Filled 2015-11-11 (×5): qty 50

## 2015-11-11 MED ORDER — ACETAMINOPHEN 650 MG RE SUPP: 650.0000 mg | Freq: Four times a day (QID) | RECTAL | Status: DC | PRN

## 2015-11-11 MED ORDER — PANTOPRAZOLE SODIUM 40 MG PO TBEC
40.0000 mg | DELAYED_RELEASE_TABLET | Freq: Every day | ORAL | Status: DC
  Administered 2015-11-11 – 2015-11-13 (×3): 40 mg via ORAL
  Filled 2015-11-11 (×3): qty 1

## 2015-11-11 MED ORDER — IOHEXOL 300 MG/ML  SOLN
25.0000 mL | INTRAMUSCULAR | Status: AC | PRN
  Administered 2015-11-11: 25 mL via ORAL

## 2015-11-11 MED ORDER — PROMETHAZINE HCL 25 MG/ML IJ SOLN
12.5000 mg | Freq: Once | INTRAMUSCULAR | Status: AC
  Administered 2015-11-11: 12.5 mg via INTRAVENOUS
  Filled 2015-11-11: qty 1

## 2015-11-11 MED ORDER — LORAZEPAM 0.5 MG PO TABS
0.5000 mg | ORAL_TABLET | Freq: Every day | ORAL | Status: DC
  Administered 2015-11-13: 0.5 mg via ORAL
  Filled 2015-11-11: qty 1

## 2015-11-11 MED ORDER — ONDANSETRON HCL 4 MG/2ML IJ SOLN
4.0000 mg | Freq: Once | INTRAMUSCULAR | Status: AC
  Administered 2015-11-11: 4 mg via INTRAVENOUS

## 2015-11-11 MED ORDER — HYDRALAZINE HCL 20 MG/ML IJ SOLN
10.0000 mg | INTRAMUSCULAR | Status: DC | PRN
  Administered 2015-11-11: 10 mg via INTRAVENOUS
  Filled 2015-11-11: qty 1

## 2015-11-11 MED ORDER — SODIUM CHLORIDE 0.9 % IJ SOLN
3.0000 mL | Freq: Two times a day (BID) | INTRAMUSCULAR | Status: DC
  Administered 2015-11-11 – 2015-11-13 (×3): 3 mL via INTRAVENOUS

## 2015-11-11 MED ORDER — HEPARIN SODIUM (PORCINE) 5000 UNIT/ML IJ SOLN
5000.0000 [IU] | Freq: Three times a day (TID) | INTRAMUSCULAR | Status: DC
  Administered 2015-11-11 – 2015-11-13 (×5): 5000 [IU] via SUBCUTANEOUS
  Filled 2015-11-11 (×4): qty 1

## 2015-11-11 MED ORDER — ONDANSETRON HCL 4 MG/2ML IJ SOLN
4.0000 mg | Freq: Four times a day (QID) | INTRAMUSCULAR | Status: DC | PRN
  Filled 2015-11-11: qty 2

## 2015-11-11 MED ORDER — ACETAMINOPHEN 325 MG PO TABS: 650.0000 mg | ORAL_TABLET | Freq: Four times a day (QID) | ORAL | Status: DC | PRN

## 2015-11-11 NOTE — ED Notes (Signed)
Placed pt on bedside toilet. Pt had dark diarrhea.

## 2015-11-11 NOTE — Progress Notes (Signed)
ANTIBIOTIC CONSULT NOTE - INITIAL  Pharmacy Consult for Zosyn Indication: Intra-abdominal infection  Allergies  Allergen Reactions  . Paxil [Paroxetine Hcl] Other (See Comments)    unknown    Patient Measurements: TBW 64 kg  Vital Signs: Temp: 98.8 F (37.1 C) (01/13 0116) BP: 190/76 mmHg (01/13 1253) Pulse Rate: 67 (01/13 1253) Intake/Output from previous day:   Intake/Output from this shift:    Labs:  Recent Labs  11/11/15 0130  WBC 9.3  HGB 14.2  PLT 159  CREATININE 2.01*   CrCl cannot be calculated (Unknown ideal weight.). No results for input(s): VANCOTROUGH, VANCOPEAK, VANCORANDOM, GENTTROUGH, GENTPEAK, GENTRANDOM, TOBRATROUGH, TOBRAPEAK, TOBRARND, AMIKACINPEAK, AMIKACINTROU, AMIKACIN in the last 72 hours.   Microbiology: No results found for this or any previous visit (from the past 720 hour(s)).  Medical History: Past Medical History  Diagnosis Date  . Hypertension   . Sciatica   . Coronary artery disease   . CKD (chronic kidney disease), stage III     Assessment: 75 yo F presents on 1/13 with abdominal pain. Pharmacy consulted to dose Zosyn for intra-abdominal infection. Afebrile, WBC wnl. SCr elevated at 2.01, CrCl ~7425ml/min.  Goal of Therapy:  Resolution of infection  Plan:  Give Zosyn 3.375g IV (30 min infusion) x 1, then start Zosyn 3.375 gm IV q8h (4 hour infusion)  Enzo BiNathan Stormy Sabol, PharmD, [facility] Clinical Pharmacist Pager [phone] 11/11/2015 1:03 PM

## 2015-11-11 NOTE — ED Notes (Signed)
Per EMS, pt has had N/D, RUQ pain and lack of appetite for 1 week. EMS states very poor hygenic status of home (dirty, food left out, extremely cluttered, etc) and of pt.

## 2015-11-11 NOTE — ED Notes (Signed)
Placed pt on bedside toilet. 

## 2015-11-11 NOTE — ED Notes (Signed)
Patient transported to CT 

## 2015-11-11 NOTE — ED Provider Notes (Signed)
CSN: [id number]     Arrival date & time 11/11/15  4098 History  By signing my name below, I, Arianna Nassar, attest that this documentation has been prepared under the direction and in the presence of Shon Baton, MD. Electronically Signed: Octavia Heir, ED Scribe. 11/11/2015. 1:54 AM.    Chief Complaint  Patient presents with  . Abdominal Pain  . Nausea  . Diarrhea      The history is provided by the patient. No language interpreter was used.   HPI Comments: [name] is a 75 y.o. female with a hx of HTN, kidney disease, and CAD who presents to the Emergency Department complaining of constant, gradual worsening, dull, lower abdominal pain onset one week ago with associated diarrhea and nausea.Pt has been taking peptid to alleviate her diarrhea with no relief. Pt denies vomiting, fever, recent travel, sick contacts, blood in stool, dysuria, and hematuria.  Past Medical History  Diagnosis Date  . Hypertension   . Sciatica   . Coronary artery disease    Past Surgical History  Procedure Laterality Date  . Coronary angioplasty with stent placement  within past 10 years  . Abdominal surgery     History reviewed. No pertinent family history. Social History  Substance Use Topics  . Smoking status: Current Every Day Smoker -- 0.50 packs/day for 53 years    Types: Cigarettes  . Smokeless tobacco: Never Used  . Alcohol Use: 0.6 oz/week    1 Glasses of wine per week     Comment: occasional   OB History    No data available     Review of Systems  Constitutional: Negative for fever.  Gastrointestinal: Positive for nausea, abdominal pain and diarrhea. Negative for vomiting and blood in stool.  Genitourinary: Negative for dysuria and hematuria.  All other systems reviewed and are negative.     Allergies  Paxil  Home Medications   Prior to Admission medications   Medication Sig Start Date End Date Taking? Authorizing Provider  acetaminophen (TYLENOL) 500 MG  tablet Take 500 mg by mouth every 6 (six) hours as needed for pain.    Historical Provider, MD  alum & mag hydroxide-simeth (MAALOX PLUS) 400-400-40 MG/5ML suspension Take 5 mLs by mouth every 6 (six) hours as needed for indigestion.    Historical Provider, MD  amLODipine (NORVASC) 10 MG tablet Take 1 tablet (10 mg total) by mouth daily. 02/06/14   Zannie Cove, MD  aspirin 81 MG tablet Take 81 mg by mouth 2 (two) times daily.    Historical Provider, MD  atenolol (TENORMIN) 50 MG tablet Take 1 tablet (50 mg total) by mouth every evening. 02/06/14   Zannie Cove, MD  Cholecalciferol (VITAMIN D-3 PO) Take 1 tablet by mouth daily.    Historical Provider, MD  isosorbide mononitrate (IMDUR) 60 MG 24 hr tablet Take 1 tablet (60 mg total) by mouth daily. 02/06/14   Zannie Cove, MD  lidocaine (XYLOCAINE) 2 % solution Take 5 mLs by mouth 4 (four) times daily as needed for pain.    Historical Provider, MD  losartan (COZAAR) 50 MG tablet Take 1.5 tablets (75 mg total) by mouth daily. 03/11/13   Runell Gess, MD  mirtazapine (REMERON) 30 MG tablet Take 30 mg by mouth at bedtime.    Historical Provider, MD  nitroGLYCERIN (NITROLINGUAL) 0.4 MG/SPRAY spray Place 1 spray under the tongue every 5 (five) minutes as needed for chest pain.    Historical Provider, MD  omeprazole (PRILOSEC)  20 MG capsule Take 20 mg by mouth daily.    Historical Provider, MD   Triage vitals: BP 174/86 mmHg  Pulse 63  Temp(Src) 98.8 F (37.1 C)  Resp 19  SpO2 100% Physical Exam  Constitutional: She is oriented to person, place, and time.  Elderly, no acute distress  HENT:  Head: Normocephalic and atraumatic.  Cardiovascular: Normal rate, regular rhythm and normal heart sounds.   No murmur heard. Pulmonary/Chest: Effort normal and breath sounds normal. No respiratory distress. She has no wheezes.  Abdominal: Soft. Bowel sounds are normal. There is tenderness. There is no rebound and no guarding.  She is tenderness to  palpation without rebound or guarding  Neurological: She is alert and oriented to person, place, and time.  Skin: Skin is warm and dry.  Psychiatric: She has a normal mood and affect.  Nursing note and vitals reviewed.   ED Course  Procedures  DIAGNOSTIC STUDIES: Oxygen Saturation is 100% on RA, normal by my interpretation.  COORDINATION OF CARE:  1:52 AM Discussed treatment plan which includes lab work with pt at bedside and pt agreed to plan.  Labs Review Labs Reviewed  COMPREHENSIVE METABOLIC PANEL - Abnormal; Notable for the following:    BUN 41 (*)    Creatinine, Ser 2.01 (*)    GFR calc non Af Amer 23 (*)    GFR calc Af Amer 27 (*)    All other components within normal limits  URINALYSIS, ROUTINE W REFLEX MICROSCOPIC (NOT AT [facility]) - Abnormal; Notable for the following:    APPearance CLOUDY (*)    Hgb urine dipstick LARGE (*)    Bilirubin Urine MODERATE (*)    Ketones, ur 15 (*)    All other components within normal limits  URINE MICROSCOPIC-ADD ON - Abnormal; Notable for the following:    Squamous Epithelial / LPF 6-30 (*)    Bacteria, UA MANY (*)    Casts HYALINE CASTS (*)    All other components within normal limits  LIPASE, BLOOD  CBC  CBG MONITORING, ED    Imaging Review Dg Abd Acute W/chest  11/11/2015  CLINICAL DATA:  Initial valuation for acute vomiting. EXAM: DG ABDOMEN ACUTE W/ 1V CHEST COMPARISON:  Prior study from 06/11/2015. FINDINGS: Mild cardiomegaly is stable. Atheromatous plaque within the aortic arch. Mediastinal silhouette within normal limits. Lungs are normally inflated. Minimal atelectatic changes at the left lung base. No focal infiltrate. No pulmonary edema or pleural effusion. No pneumothorax. Bowel gas pattern within normal limits without evidence of obstruction or ileus. No abnormal bowel wall thickening. No free air. No soft tissue mass or abnormal calcification. Scoliosis with multilevel degenerative changes within the lower lumbar spine. No  acute osseus abnormality. IMPRESSION: 1. Nonobstructive bowel gas pattern with no radiographic evidence for acute intra-abdominal process. 2. Mild left basilar atelectasis. No other active cardiopulmonary disease. Electronically Signed   By: Rise MuBenjamin  McClintock M.D.   On: 11/11/2015 04:04   I have personally reviewed and evaluated these images and lab results as part of my medical decision-making.   EKG Interpretation   Date/Time:  Friday November 11 2015 06:25:24 EST Ventricular Rate:  60 PR Interval:  169 QRS Duration: 79 QT Interval:  471 QTC Calculation: 471 R Axis:   74 Text Interpretation:  Sinus rhythm Consider left ventricular hypertrophy  Confirmed by HORTON  MD, COURTNEY ([id number]) on 11/11/2015 7:50:06 AM      MDM   Final diagnoses:  Abdominal pain    Patient presents  with abdominal pain, nausea, vomiting, and diarrhea. Acute on chronic and ongoing for the last several weeks. Reports persistent nausea and seeing her primary physician for this. She has no signs of peritonitis on exam.Basic labwork obtained. Patient given fluids and Zofran, Creatinine 2. Baseline around 1-1/2. She does appear volume down.  Lain films and basic labwork reassuring. On recheck, patient reports persistent nausea. Will obtain CT abdomen. f note, she was notably hypertensive. Denies chest pain or abdominal pain. Reports that she's not taken her blood pressure medication.  Patient was given IV hydralazine.  Signed out to Dr. Joni Fears.     I personally performed the services described in this documentation, which was scribed in my presence. The recorded information has been reviewed and is accurate.   Shon Baton, MD 11/11/15 [phone]

## 2015-11-11 NOTE — ED Notes (Signed)
Pt pivoted to bedside toilet.  Pt supper ordered.

## 2015-11-11 NOTE — H&P (Signed)
[facility] History and Physical  [name] Schiltz JXB:[id number] DOB: Nov 10, 1940 DOA: 11/11/2015  Referring physician: Crista Curbana Liu MD PCP: Lorenda PeckOBERTS, [name], MD   Chief Complaint: Abdominal pain  HPI: [name] Hennes is a 75 y.o. female with a history of htn, CAD and kidney disease  Who presented to the Emergency department because of lower abdominal pain, vomiting and diarrhea.  Pt reports she has had diarrhea and nauseahe past week.  She has had abdominal pain for 2 years on and off.  Pt has been taking pepcid for symptoms with no relief.  Pt denies any black,tarry or bloody diarrhea.  No travel. Pt denies uti symptoms.  Pt lives alone.    Review of Systems:  Constitutional:  No weight loss, night sweats, Fevers, chills, fatigue.  HEENT:  No headaches, Difficulty swallowing,Tooth/dental problems,Sore throat,  No sneezing, itching, ear ache, nasal congestion, post nasal drip,  Cardio-vascular:  No chest pain, Orthopnea, PND, swelling in lower extremities, anasarca, dizziness, palpitations  GI:  Positive for abdominal pain Positive for diarrhea Positive for nausea Resp:  No shortness of breath with exertion or at rest. No excess mucus, no productive cough, No non-productive cough, No coughing up of blood.No change in color of mucus.No wheezing.No chest wall deformity  Skin:  no rash or lesions.  GU:  no dysuria, change in color of urine, no urgency or frequency. No flank pain.  Musculoskeletal:  No joint pain or swelling. No decreased range of motion. No back pain.  Psych:  No change in mood or affect. No depression or anxiety. No memory loss.   Past Medical History  Diagnosis Date  . Hypertension   . Sciatica   . Coronary artery disease    Past Surgical History  Procedure Laterality Date  . Coronary angioplasty with stent placement  within past 10 years  . Abdominal surgery     Social History:  reports that she has been smoking Cigarettes.  She has a 26.5  pack-year smoking history. She has never used smokeless tobacco. She reports that she drinks about 0.6 oz of alcohol per week. She reports that she does not use illicit drugs.  Allergies  Allergen Reactions  . Paxil [Paroxetine Hcl] Other (See Comments)    unknown    History reviewed. No pertinent family history.   Prior to Admission medications   Medication Sig Start Date End Date Taking? Authorizing Provider  aspirin 81 MG tablet Take 81 mg by mouth daily.    Yes Historical Provider, MD  atenolol (TENORMIN) 50 MG tablet Take 1 tablet (50 mg total) by mouth every evening. 02/06/14  Yes Zannie CovePreetha Joseph, MD  Cholecalciferol (VITAMIN D-3 PO) Take 1 tablet by mouth daily.   Yes Historical Provider, MD  HYDROcodone-acetaminophen (NORCO/VICODIN) 5-325 MG tablet Take 1 tablet by mouth every 6 (six) hours as needed. For lower back pain   Yes Historical Provider, MD  isosorbide mononitrate (IMDUR) 30 MG 24 hr tablet Take 30 mg by mouth daily.   Yes Historical Provider, MD  LORazepam (ATIVAN) 0.5 MG tablet Take 0.5 mg by mouth at bedtime.   Yes Historical Provider, MD  losartan (COZAAR) 100 MG tablet Take 100 mg by mouth daily.   Yes Historical Provider, MD  omeprazole (PRILOSEC) 20 MG capsule Take 20 mg by mouth daily.   Yes Historical Provider, MD  simvastatin (ZOCOR) 20 MG tablet Take 20 mg by mouth daily.   Yes Historical Provider, MD  sucralfate (CARAFATE) 1 g tablet Take 1 g  by mouth 4 (four) times daily -  with meals and at bedtime.   Yes Historical Provider, MD  acetaminophen (TYLENOL) 500 MG tablet Take 500 mg by mouth every 6 (six) hours as needed for pain.    Historical Provider, MD  alum & mag hydroxide-simeth (MAALOX PLUS) 400-400-40 MG/5ML suspension Take 5 mLs by mouth every 6 (six) hours as needed for indigestion.    Historical Provider, MD  amLODipine (NORVASC) 10 MG tablet Take 1 tablet (10 mg total) by mouth daily. Patient not taking: Reported on 11/11/2015 02/06/14   Zannie Cove,  MD  isosorbide mononitrate (IMDUR) 60 MG 24 hr tablet Take 1 tablet (60 mg total) by mouth daily. Patient not taking: Reported on 11/11/2015 02/06/14   Zannie Cove, MD  lidocaine (XYLOCAINE) 2 % solution Take 5 mLs by mouth 4 (four) times daily as needed for pain.    Historical Provider, MD  losartan (COZAAR) 50 MG tablet Take 1.5 tablets (75 mg total) by mouth daily. Patient not taking: Reported on 11/11/2015 03/11/13   Runell Gess, MD  mirtazapine (REMERON) 30 MG tablet Take 30 mg by mouth at bedtime.    Historical Provider, MD  nitroGLYCERIN (NITROLINGUAL) 0.4 MG/SPRAY spray Place 1 spray under the tongue every 5 (five) minutes as needed for chest pain.    Historical Provider, MD   Physical Exam: Filed Vitals:   11/11/15 0930 11/11/15 1030 11/11/15 1115 11/11/15 1130  BP: 191/78 181/69  192/89  Pulse: 105 69 67   Temp:      Resp: 13 17 21 13   SpO2: 96% 96% 95%     Wt Readings from Last 3 Encounters:  06/17/15 140 lb (63.504 kg)  02/04/14 134 lb 7.7 oz (61 kg)  07/04/13 155 lb (70.308 kg)    General:  Appears calm and comfortable Eyes: PERRL, normal lids, irises & conjunctiva ENT: grossly normal hearing, lips & tongue Neck: no LAD, masses or thyromegaly Cardiovascular: RRR, no m/r/g. No LE edema. Telemetry: SR, no arrhythmias  Respiratory: CTA bilaterally, no w/r/r. Normal respiratory effort. Abdomen: soft, tender lower quadrants,  Right greater than left.  Skin: no rash or induration seen on limited exam Musculoskeletal: grossly normal tone BUE/BLE Psychiatric: grossly normal mood and affect, speech fluent and appropriate Neurologic: grossly non-focal.          Labs on Admission:  Basic Metabolic Panel:  Recent Labs Lab 11/11/15 0130  NA 143  K 4.1  CL 107  CO2 23  GLUCOSE 77  BUN 41*  CREATININE 2.01*  CALCIUM 10.0   Liver Function Tests:  Recent Labs Lab 11/11/15 0130  AST 38  ALT 19  ALKPHOS 59  BILITOT 0.9  PROT 7.0  ALBUMIN 4.2    Recent  Labs Lab 11/11/15 0130  LIPASE 19   No results for input(s): AMMONIA in the last 168 hours. CBC:  Recent Labs Lab 11/11/15 0130  WBC 9.3  HGB 14.2  HCT 41.7  MCV 84.2  PLT 159   Cardiac Enzymes: No results for input(s): CKTOTAL, CKMB, CKMBINDEX, TROPONINI in the last 168 hours.  BNP (last 3 results) No results for input(s): BNP in the last 8760 hours.  ProBNP (last 3 results) No results for input(s): PROBNP in the last 8760 hours. CrCl cannot be calculated (Unknown ideal weight.). CBG:  Recent Labs Lab 11/11/15 0133  GLUCAP 70    Radiological Exams on Admission: Ct Abdomen Pelvis Wo Contrast  11/11/2015  CLINICAL DATA:  pt has had N/D, RUQ  pain and lack of appetite for 1 week. EXAM: CT ABDOMEN AND PELVIS WITHOUT CONTRAST TECHNIQUE: Multidetector CT imaging of the abdomen and pelvis was performed following the standard protocol without IV contrast. COMPARISON:  05/14/2013, 11/11/2015 and prior studies including 05/03/2009 FINDINGS: Lower chest: Small left pleural effusion is associated with atelectasis at the left lung base. Coronary artery calcification is present. Upper abdomen: Small low-attenuation lesions are identified within the liver, largest measuring approximately 10 mm. No focal abnormality identified within the spleen, pancreas. The gallbladder is present and is distended. Lesions appear stable compared with previous contrast-enhanced exam and are consistent with small cysts. Stable 3.1 cm left renal cyst. A right parapelvic cyst is now smaller, measuring 1.7 cm. No ureteral stones. Bilateral adrenal hyperplasia noted, stable. Within the left upper quadrant there is a small solid nodule just below the greater curvature of the stomach measuring 1.8 x 1.0 cm. Nodule is is discrete from the stomach, pancreatic tail, and colon and is slightly different attenuation from the spleen making splenule less likely. Nodule is stable over multiple prior studies and favored to be  benign. Gastrointestinal tract: The stomach and small bowel loops are normal in appearance. The appendix is mildly thickened. There is periappendiceal stranding. Appendix measures 8 mm. Colonic loops are normal in caliber. There are numerous colonic diverticula but no acute diverticulitis identified. There is mild thickening of the peritoneal reflection adjacent to the descending colon and this appears stable. Findings may indicate chronic diverticulitis. Pelvis: The uterus is present. No adnexal mass or free pelvic fluid. Retroperitoneum: There is marked atherosclerotic cysts of the abdominal aorta. No aneurysm. No retroperitoneal or mesenteric adenopathy. Abdominal wall: Unremarkable. Osseous structures: There are degenerative changes in the lumbar spine, particularly involving L4-5. At this level there is 2 mm anterolisthesis and vacuum disc phenomenon. Degenerative disc disease also noted at L2-3. IMPRESSION: 1. Mild appendiceal thickening and mild periappendiceal inflammatory change. 2. No associated abscess or free intraperitoneal air. 3. Colonic diverticulosis. 4. Bilateral adrenal hyperplasia. 5. Indeterminate left upper quadrant solid nodule, favored to be benign given stability. Electronically Signed   By: Norva Pavlov M.D.   On: 11/11/2015 10:30   Dg Abd Acute W/chest  11/11/2015  CLINICAL DATA:  Initial valuation for acute vomiting. EXAM: DG ABDOMEN ACUTE W/ 1V CHEST COMPARISON:  Prior study from 06/11/2015. FINDINGS: Mild cardiomegaly is stable. Atheromatous plaque within the aortic arch. Mediastinal silhouette within normal limits. Lungs are normally inflated. Minimal atelectatic changes at the left lung base. No focal infiltrate. No pulmonary edema or pleural effusion. No pneumothorax. Bowel gas pattern within normal limits without evidence of obstruction or ileus. No abnormal bowel wall thickening. No free air. No soft tissue mass or abnormal calcification. Scoliosis with multilevel  degenerative changes within the lower lumbar spine. No acute osseus abnormality. IMPRESSION: 1. Nonobstructive bowel gas pattern with no radiographic evidence for acute intra-abdominal process. 2. Mild left basilar atelectasis. No other active cardiopulmonary disease. Electronically Signed   By: Rise Mu M.D.   On: 11/11/2015 04:04    EKG: Independently reviewed.  Assessment/Plan Active Problems:   CKD (chronic kidney disease) stage 3, GFR 30-59 ml/min   HTN (hypertension)   Acute renal failure (ARF) (HCC)   Abdominal pain   1. Acute renal failure/chronic renal failure  Bun is 41 and creatine is 2.01.  Creatine was 1.27 8/17.  Gentle hydration and nausea control   2. Hypertension.  I suspect pt is not taking listed medications,  Pharmacy will be contacted.  Hydralazine Iv. Will continue atenolol.  Will hold imdur and losartan  3 Abdominal Pain possible appendicitis by Ct scan Serial abdominal exams Zosyn per pharmacy Will follow with surgery Gi consult  Code Status:Full DVT Prophylaxis: heparin Family Communication: no family  Disposition Plan:  Will monitor renal failure and hypertension,  Serial abdominal exams  Time spent: 30 Consult Dr. Derrell Lolling with General Surgery.  [facility] Pager [phone]

## 2015-11-11 NOTE — ED Notes (Signed)
Report attempted 

## 2015-11-11 NOTE — ED Provider Notes (Signed)
Please see previous physician's note regarding patient's presenting history and physical, initial ED course and associated MDM. In short, this is a 75 year old female with history of CAD and hypertension who presents with one week to several weeks of low abdominal pain and diarrhea. At time of sign out, she is pending CT abdomen and pelvis. Blood work and presentation is reviewed. No evidence of leukocytosis or other major metabolic or electrolyte derangements. She has been afebrile and hemodynamically stable here. On exam she has a soft and non-peritoneal abdomen with more focal right lower abdominal tenderness to palpation. CT abdomen and pelvis is visualized and reviewed with radiology. No clear appendicitis, but she does have a thickened appendiceal wall with. Appendiceal inflammation. Given that this is where she was having her pain on exam, general surgery his consultative, although she has a poor story for acute appendicitis. Patient was evaluated by Dr. Derrell Lollingamirez and his PA. Recommendations were admission for serial abdominal exams and potentially GI consult. They did not feel that patient required surgery at this time, but will continue to follow.   Lavera Guiseana Duo Axle Parfait, MD 11/11/15 [phone]

## 2015-11-11 NOTE — Consult Note (Signed)
[facility] Consult Note  [name] 05-17-1941  [id number].    Requesting MD: Dr. Tyrone Schimke Chief Complaint/Reason for Consult: RLQ Abdominal Pain  HPI:  [name] is a 75 yo AA female with a history of CKD, HTN, and CAD who presented to the [facility] via EMS early this morning (11/11/15) with a 1 week history of RLQ abdominal pain. The pain has been mild and intermittent for up to two years until the last week when the pain has worsened.  The pain became constant last night which worried her and prompted her to come to the ER.  The pain is described as dull/achy and 8/10 and unrelieved with Pepcid.  It is associated with intermittent nausea x1 week and diarrhea x1 month. The pain has been limited to the RLQ. She reports that she has had a recent weight loss of 5-10lbs, night sweats, and decreased appetite. Has had a runny nose for a few days, but this is a chronic problem. She denies vomiting, no CP/SOB, headache, URI symptoms, fevers/chills, hematochezia or no urinary changes.  She denies radicular pain, or any precipitating or alleviating factors.  She reports that she follows a low-sodium diet and rarely eats meat.  No recent travel or sick contacts.  Last colonoscopy was 2 years ago and was reportedly normal.  No history of abdominal surgery or hernias.  Last admission was April 2016 for CKD3, syncope, HTN and CAD.  Was seen in ER in August 2016 for lightheadedness.   ROS: Neuro: Reports lapses in memory and thinking unclearly for past 6 mo. Repro: Has not been tested for STIs that she can remember; Reports that she had at least two abortions in her 79s   History reviewed. No pertinent family history.  Past Medical History  Diagnosis Date  . Hypertension   . Sciatica   . Coronary artery disease   . CKD (chronic kidney disease), stage III     Past Surgical History  Procedure Laterality Date  . Coronary angioplasty with stent placement  within past 10 years  .  Abdominal surgery    . Tonsillectomy and adenoidectomy  At Age 67    Social History:  reports that she has been smoking Cigarettes.  She has a 26.5 pack-year smoking history. She has never used smokeless tobacco. She reports that she drinks about 0.6 oz of alcohol per week. 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 Lives alone. Has one adopted son (35yo).   Allergies:  Allergies  Allergen Reactions  . Paxil [Paroxetine Hcl] Other (See Comments)    unknown     (Not in a hospital admission)  Blood pressure 192/89, pulse 67, temperature 98.8 F (37.1 C), resp. rate 13, SpO2 95 %. Physical Exam: General: pleasant, WD/WN AA female who appears older than stated age and who is laying in bed in NAD HEENT: head is normocephalic, atraumatic.  Sclera are noninjected.  PERRL.  Ears and nose without any masses or lesions.  Mouth is pink and moist Heart: regular, rate, and rhythm.  No obvious murmurs, gallops, or rubs noted.  Palpable pedal pulses bilaterally Lungs: CTAB, no wheezes, rhonchi, or rales noted.  Respiratory effort nonlabored Abd: soft, tender to light palaption in LRQ, +BS, no masses, hernias, or organomegaly; - Rovsing's, - rebound, localized subjective guarding MS: all 4 extremities are symmetrical with no cyanosis, clubbing, or edema; has bruises from a fall "around 1 month  ago" on her knees bilaterally Skin: warm and dry with no masses, lesions, or rashes Psych: A&Ox3 with an appropriate affect  Results for orders placed or performed during the hospital encounter of 11/11/15 (from the past 48 hour(s))  Lipase, blood     Status: None   Collection Time: 11/11/15  1:30 AM  Result Value Ref Range   Lipase 19 11 - 51 U/L  Comprehensive metabolic panel     Status: Abnormal   Collection Time: 11/11/15  1:30 AM  Result Value Ref Range   Sodium 143 135 - 145 mmol/L   Potassium 4.1 3.5 - 5.1 mmol/L   Chloride  107 101 - 111 mmol/L   CO2 23 22 - 32 mmol/L   Glucose, Bld 77 65 - 99 mg/dL   BUN 41 (H) 6 - 20 mg/dL   Creatinine, Ser 2.01 (H) 0.44 - 1.00 mg/dL   Calcium 10.0 8.9 - 10.3 mg/dL   Total Protein 7.0 6.5 - 8.1 g/dL   Albumin 4.2 3.5 - 5.0 g/dL   AST 38 15 - 41 U/L   ALT 19 14 - 54 U/L   Alkaline Phosphatase 59 38 - 126 U/L   Total Bilirubin 0.9 0.3 - 1.2 mg/dL   GFR calc non Af Amer 23 (L) >60 mL/min   GFR calc Af Amer 27 (L) >60 mL/min    Comment: (NOTE) The eGFR has been calculated using the CKD EPI equation. This calculation has not been validated in all clinical situations. eGFR's persistently <60 mL/min signify possible Chronic Kidney Disease.    Anion gap 13 5 - 15  CBC     Status: None   Collection Time: 11/11/15  1:30 AM  Result Value Ref Range   WBC 9.3 4.0 - 10.5 K/uL   RBC 4.95 3.87 - 5.11 MIL/uL   Hemoglobin 14.2 12.0 - 15.0 g/dL   HCT 41.7 36.0 - 46.0 %   MCV 84.2 78.0 - 100.0 fL   MCH 28.7 26.0 - 34.0 pg   MCHC 34.1 30.0 - 36.0 g/dL   RDW 15.2 11.5 - 15.5 %   Platelets 159 150 - 400 K/uL    Comment: SPECIMEN CHECKED FOR CLOTS REPEATED TO VERIFY   CBG monitoring, ED     Status: None   Collection Time: 11/11/15  1:33 AM  Result Value Ref Range   Glucose-Capillary 70 65 - 99 mg/dL  Urinalysis, Routine w reflex microscopic (not at [facility])     Status: Abnormal   Collection Time: 11/11/15  5:34 AM  Result Value Ref Range   Color, Urine YELLOW YELLOW   APPearance CLOUDY (A) CLEAR   Specific Gravity, Urine 1.026 1.005 - 1.030   pH 5.0 5.0 - 8.0   Glucose, UA NEGATIVE NEGATIVE mg/dL   Hgb urine dipstick LARGE (A) NEGATIVE   Bilirubin Urine MODERATE (A) NEGATIVE   Ketones, ur 15 (A) NEGATIVE mg/dL   Protein, ur NEGATIVE NEGATIVE mg/dL   Nitrite NEGATIVE NEGATIVE   Leukocytes, UA NEGATIVE NEGATIVE  Urine microscopic-add on     Status: Abnormal   Collection Time: 11/11/15  5:34 AM  Result Value Ref Range   Squamous Epithelial / LPF 6-30 (A) NONE SEEN   WBC,  UA 0-5 0 - 5 WBC/hpf   RBC / HPF 6-30 0 - 5 RBC/hpf   Bacteria, UA MANY (A) NONE SEEN   Casts HYALINE CASTS (A) NEGATIVE   Urine-Other YEAST PRESENT    Ct Abdomen Pelvis Wo Contrast  11/11/2015  CLINICAL DATA:  pt has had N/D, RUQ pain and lack of appetite for 1 week. EXAM: CT ABDOMEN AND PELVIS WITHOUT CONTRAST TECHNIQUE: Multidetector CT imaging of the abdomen and pelvis was performed following the standard protocol without IV contrast. COMPARISON:  05/14/2013, 11/11/2015 and prior studies including 05/03/2009 FINDINGS: Lower chest: Small left pleural effusion is associated with atelectasis at the left lung base. Coronary artery calcification is present. Upper abdomen: Small low-attenuation lesions are identified within the liver, largest measuring approximately 10 mm. No focal abnormality identified within the spleen, pancreas. The gallbladder is present and is distended. Lesions appear stable compared with previous contrast-enhanced exam and are consistent with small cysts. Stable 3.1 cm left renal cyst. A right parapelvic cyst is now smaller, measuring 1.7 cm. No ureteral stones. Bilateral adrenal hyperplasia noted, stable. Within the left upper quadrant there is a small solid nodule just below the greater curvature of the stomach measuring 1.8 x 1.0 cm. Nodule is is discrete from the stomach, pancreatic tail, and colon and is slightly different attenuation from the spleen making splenule less likely. Nodule is stable over multiple prior studies and favored to be benign. Gastrointestinal tract: The stomach and small bowel loops are normal in appearance. The appendix is mildly thickened. There is periappendiceal stranding. Appendix measures 8 mm. Colonic loops are normal in caliber. There are numerous colonic diverticula but no acute diverticulitis identified. There is mild thickening of the peritoneal reflection adjacent to the descending colon and this appears stable. Findings may indicate chronic  diverticulitis. Pelvis: The uterus is present. No adnexal mass or free pelvic fluid. Retroperitoneum: There is marked atherosclerotic cysts of the abdominal aorta. No aneurysm. No retroperitoneal or mesenteric adenopathy. Abdominal wall: Unremarkable. Osseous structures: There are degenerative changes in the lumbar spine, particularly involving L4-5. At this level there is 2 mm anterolisthesis and vacuum disc phenomenon. Degenerative disc disease also noted at L2-3. IMPRESSION: 1. Mild appendiceal thickening and mild periappendiceal inflammatory change. 2. No associated abscess or free intraperitoneal air. 3. Colonic diverticulosis. 4. Bilateral adrenal hyperplasia. 5. Indeterminate left upper quadrant solid nodule, favored to be benign given stability. Electronically Signed   By: Nolon Nations M.D.   On: 11/11/2015 10:30   Dg Abd Acute W/chest  11/11/2015  CLINICAL DATA:  Initial valuation for acute vomiting. EXAM: DG ABDOMEN ACUTE W/ 1V CHEST COMPARISON:  Prior study from 06/11/2015. FINDINGS: Mild cardiomegaly is stable. Atheromatous plaque within the aortic arch. Mediastinal silhouette within normal limits. Lungs are normally inflated. Minimal atelectatic changes at the left lung base. No focal infiltrate. No pulmonary edema or pleural effusion. No pneumothorax. Bowel gas pattern within normal limits without evidence of obstruction or ileus. No abnormal bowel wall thickening. No free air. No soft tissue mass or abnormal calcification. Scoliosis with multilevel degenerative changes within the lower lumbar spine. No acute osseus abnormality. IMPRESSION: 1. Nonobstructive bowel gas pattern with no radiographic evidence for acute intra-abdominal process. 2. Mild left basilar atelectasis. No other active cardiopulmonary disease. Electronically Signed   By: Jeannine Boga M.D.   On: 11/11/2015 04:04     Assessment/Plan RLQ Abdominal Pain -Admit to medicine, we will consult -NPO, antiemetics, IVF,  pain control -Serial exams, recheck labs in am -Doubt this is her appendix, but it could be her cecal diverticula.  Will see how she is doing tomorrow and determine further plan depending on improvement, but for now medical management. -No plans for surgery at this time -Very well may need GI workup including Colonoscopy  Mild appendiceal thickening and mild periappendiceal inflammatory as seen on CT Cecal diverticula Nausea Diarrhea Colonic diverticulosis on CT Bilateral adrenal hyperplasia on CT -Per Medicine Indeterminate LUQ abdominal solid nodule, favored to be benign given stability on CT    Madelaine Bhat, PA-S [facility] 11/11/2015, 12:25 PM Office: [phone]  ----------------------------------------------------------------------------------------------------------- General Surgery PA Preceptor Note:  I agree with the above PA students findings as above.  I have made changes as appropriate.  Coralie Keens, PA-C General Surgery [facility] Pager: [phone] Office: [phone]

## 2015-11-12 DIAGNOSIS — N183 Chronic kidney disease, stage 3 (moderate): Secondary | ICD-10-CM

## 2015-11-12 DIAGNOSIS — R109 Unspecified abdominal pain: Secondary | ICD-10-CM

## 2015-11-12 DIAGNOSIS — N179 Acute kidney failure, unspecified: Secondary | ICD-10-CM

## 2015-11-12 DIAGNOSIS — E43 Unspecified severe protein-calorie malnutrition: Secondary | ICD-10-CM | POA: Insufficient documentation

## 2015-11-12 LAB — COMPREHENSIVE METABOLIC PANEL
ALBUMIN: 3.1 g/dL — AB (ref 3.5–5.0)
ALT: 12 U/L — ABNORMAL LOW (ref 14–54)
ANION GAP: 8 (ref 5–15)
AST: 23 U/L (ref 15–41)
Alkaline Phosphatase: 45 U/L (ref 38–126)
BILIRUBIN TOTAL: 0.6 mg/dL (ref 0.3–1.2)
BUN: 33 mg/dL — ABNORMAL HIGH (ref 6–20)
CALCIUM: 8.6 mg/dL — AB (ref 8.9–10.3)
CO2: 20 mmol/L — ABNORMAL LOW (ref 22–32)
Chloride: 113 mmol/L — ABNORMAL HIGH (ref 101–111)
Creatinine, Ser: 1.53 mg/dL — ABNORMAL HIGH (ref 0.44–1.00)
GFR calc Af Amer: 38 mL/min — ABNORMAL LOW (ref >60)
GFR calc non Af Amer: 33 mL/min — ABNORMAL LOW (ref >60)
GLUCOSE: 56 mg/dL — AB (ref 65–99)
POTASSIUM: 4.1 mmol/L (ref 3.5–5.1)
Sodium: 141 mmol/L (ref 135–145)
TOTAL PROTEIN: 5.3 g/dL — AB (ref 6.5–8.1)

## 2015-11-12 LAB — CBC
HEMATOCRIT: 36.4 % (ref 36.0–46.0)
Hemoglobin: 11.8 g/dL — ABNORMAL LOW (ref 12.0–15.0)
MCH: 27.9 pg (ref 26.0–34.0)
MCHC: 32.4 g/dL (ref 30.0–36.0)
MCV: 86.1 fL (ref 78.0–100.0)
Platelets: 132 10*3/uL — ABNORMAL LOW (ref 150–400)
RBC: 4.23 MIL/uL (ref 3.87–5.11)
RDW: 15.3 % (ref 11.5–15.5)
WBC: 10.5 10*3/uL (ref 4.0–10.5)

## 2015-11-12 MED ORDER — ENSURE ENLIVE PO LIQD
237.0000 mL | Freq: Two times a day (BID) | ORAL | Status: DC
  Administered 2015-11-12: 237 mL via ORAL

## 2015-11-12 MED ORDER — ISOSORBIDE MONONITRATE ER 60 MG PO TB24
60.0000 mg | ORAL_TABLET | Freq: Every day | ORAL | Status: DC
  Administered 2015-11-12 – 2015-11-13 (×2): 60 mg via ORAL
  Filled 2015-11-12 (×2): qty 1

## 2015-11-12 MED ORDER — PIPERACILLIN-TAZOBACTAM 3.375 G IVPB
3.3750 g | Freq: Three times a day (TID) | INTRAVENOUS | Status: DC
  Administered 2015-11-13: 3.375 g via INTRAVENOUS
  Filled 2015-11-12 (×4): qty 50

## 2015-11-12 NOTE — Progress Notes (Signed)
[facility] Progress Note   [name]  ZOX:[id number]  DOB: 20-Jun-1941  DOA: 11/11/2015 PCP: Lorenda Peck, MD  Brief narrative: [name] is a 75 y.o. female with HTN, CAD, CKD 3 who presents with almost 1 wk of nausea, vomiting and diarrhea. CT in ER suggests possibly appendicitis. She was started on Zosyn and admitted. Surgery consult requested.    Subjective: No further abdominal pain.  Hungry and wanting to drink. No further diarrhea or vomiting.   Assessment/Plan: Principal Problem:   Abdominal pain/ vomiting/ diarrhea - symptoms improving- surgery team has started clears - will progress as tolerated  Active Problems:   AKI on CKD (chronic kidney disease) stage 3, GFR 30-59 ml/min - Cr improving to baseline    HTN (hypertension) - cont Atenolol and Imdur - hold losartan due to ARf    Protein-calorie malnutrition, severe - cont Ensure    Antibiotics: Anti-infectives    Start     Dose/Rate Route Frequency Ordered Stop   11/11/15 2000  piperacillin-tazobactam (ZOSYN) IVPB 3.375 g     3.375 g 12.5 mL/hr over 240 Minutes Intravenous Every 8 hours 11/11/15 1258     11/11/15 1300  piperacillin-tazobactam (ZOSYN) IVPB 3.375 g     3.375 g 100 mL/hr over 30 Minutes Intravenous  Once 11/11/15 1258 11/11/15 1647     Code Status:     Code Status Orders        Start     Ordered   11/11/15 1714  Full code   Continuous     11/11/15 1713    Code Status History    Date Active Date Inactive Code Status Order ID Comments User Context   02/04/2014  2:11 PM 02/06/2014  2:00 PM Full Code [id number]  Leatha Gilding, MD ED     Family Communication:   Disposition Plan: home in 1-2 days DVT prophylaxis: Heparin Consultants: none Procedures: none    Objective: Filed Weights   11/11/15 1938  Weight: 58 kg (127 lb 13.9 oz)    Intake/Output Summary (Last 24 hours) at 11/12/15 1415 Last data filed at 11/12/15 0611  Gross per 24 hour  Intake     100 ml  Output      0 ml  Net    100 ml     Vitals Filed Vitals:   11/11/15 1630 11/11/15 1843 11/11/15 1938 11/12/15 0512  BP: 114/96 172/74 145/61 164/54  Pulse: 75  101 57  Temp: 98.7 F (37.1 C)  98.7 F (37.1 C) 98.5 F (36.9 C)  TempSrc: Oral  Oral Oral  Resp: 19  20 19   Height:   5\' 7"  (1.702 m)   Weight:   58 kg (127 lb 13.9 oz)   SpO2: 95%  100% 100%    Exam:  General:  Pt is alert, not in acute distress  HEENT: No icterus, No thrush, oral mucosa moist  Cardiovascular: regular rate and rhythm, S1/S2 No murmur  Respiratory: clear to auscultation bilaterally   Abdomen: Soft, +Bowel sounds, non tender, non distended, no guarding  MSK: No cyanosis or clubbing- no pedal edema   Data Reviewed: Basic Metabolic Panel:  Recent Labs Lab 11/11/15 0130 11/11/15 1936 11/12/15 0506  NA 143  --  141  K 4.1  --  4.1  CL 107  --  113*  CO2 23  --  20*  GLUCOSE 77  --  56*  BUN 41*  --  33*  CREATININE 2.01* 1.59* 1.53*  CALCIUM 10.0  --  8.6*   Liver Function Tests:  Recent Labs Lab 11/11/15 0130 11/12/15 0506  AST 38 23  ALT 19 12*  ALKPHOS 59 45  BILITOT 0.9 0.6  PROT 7.0 5.3*  ALBUMIN 4.2 3.1*    Recent Labs Lab 11/11/15 0130  LIPASE 19   No results for input(s): AMMONIA in the last 168 hours. CBC:  Recent Labs Lab 11/11/15 0130 11/11/15 1936 11/12/15 0506  WBC 9.3 12.1* 10.5  HGB 14.2 13.3 11.8*  HCT 41.7 39.1 36.4  MCV 84.2 85.6 86.1  PLT 159 147* 132*   Cardiac Enzymes: No results for input(s): CKTOTAL, CKMB, CKMBINDEX, TROPONINI in the last 168 hours. BNP (last 3 results) No results for input(s): BNP in the last 8760 hours.  ProBNP (last 3 results) No results for input(s): PROBNP in the last 8760 hours.  CBG:  Recent Labs Lab 11/11/15 0133  GLUCAP 70    No results found for this or any previous visit (from the past 240 hour(s)).   Studies: Ct Abdomen Pelvis Wo Contrast  11/11/2015  CLINICAL DATA:  pt has had  N/D, RUQ pain and lack of appetite for 1 week. EXAM: CT ABDOMEN AND PELVIS WITHOUT CONTRAST TECHNIQUE: Multidetector CT imaging of the abdomen and pelvis was performed following the standard protocol without IV contrast. COMPARISON:  05/14/2013, 11/11/2015 and prior studies including 05/03/2009 FINDINGS: Lower chest: Small left pleural effusion is associated with atelectasis at the left lung base. Coronary artery calcification is present. Upper abdomen: Small low-attenuation lesions are identified within the liver, largest measuring approximately 10 mm. No focal abnormality identified within the spleen, pancreas. The gallbladder is present and is distended. Lesions appear stable compared with previous contrast-enhanced exam and are consistent with small cysts. Stable 3.1 cm left renal cyst. A right parapelvic cyst is now smaller, measuring 1.7 cm. No ureteral stones. Bilateral adrenal hyperplasia noted, stable. Within the left upper quadrant there is a small solid nodule just below the greater curvature of the stomach measuring 1.8 x 1.0 cm. Nodule is is discrete from the stomach, pancreatic tail, and colon and is slightly different attenuation from the spleen making splenule less likely. Nodule is stable over multiple prior studies and favored to be benign. Gastrointestinal tract: The stomach and small bowel loops are normal in appearance. The appendix is mildly thickened. There is periappendiceal stranding. Appendix measures 8 mm. Colonic loops are normal in caliber. There are numerous colonic diverticula but no acute diverticulitis identified. There is mild thickening of the peritoneal reflection adjacent to the descending colon and this appears stable. Findings may indicate chronic diverticulitis. Pelvis: The uterus is present. No adnexal mass or free pelvic fluid. Retroperitoneum: There is marked atherosclerotic cysts of the abdominal aorta. No aneurysm. No retroperitoneal or mesenteric adenopathy. Abdominal  wall: Unremarkable. Osseous structures: There are degenerative changes in the lumbar spine, particularly involving L4-5. At this level there is 2 mm anterolisthesis and vacuum disc phenomenon. Degenerative disc disease also noted at L2-3. IMPRESSION: 1. Mild appendiceal thickening and mild periappendiceal inflammatory change. 2. No associated abscess or free intraperitoneal air. 3. Colonic diverticulosis. 4. Bilateral adrenal hyperplasia. 5. Indeterminate left upper quadrant solid nodule, favored to be benign given stability. Electronically Signed   By: Norva Pavlov M.D.   On: 11/11/2015 10:30   Dg Abd Acute W/chest  11/11/2015  CLINICAL DATA:  Initial valuation for acute vomiting. EXAM: DG ABDOMEN ACUTE W/ 1V CHEST COMPARISON:  Prior study from 06/11/2015. FINDINGS: Mild cardiomegaly is  stable. Atheromatous plaque within the aortic arch. Mediastinal silhouette within normal limits. Lungs are normally inflated. Minimal atelectatic changes at the left lung base. No focal infiltrate. No pulmonary edema or pleural effusion. No pneumothorax. Bowel gas pattern within normal limits without evidence of obstruction or ileus. No abnormal bowel wall thickening. No free air. No soft tissue mass or abnormal calcification. Scoliosis with multilevel degenerative changes within the lower lumbar spine. No acute osseus abnormality. IMPRESSION: 1. Nonobstructive bowel gas pattern with no radiographic evidence for acute intra-abdominal process. 2. Mild left basilar atelectasis. No other active cardiopulmonary disease. Electronically Signed   By: Rise MuBenjamin  McClintock M.D.   On: 11/11/2015 04:04    Scheduled Meds:  Scheduled Meds: . aspirin EC  81 mg Oral Daily  . atenolol  100 mg Oral Daily  . feeding supplement (ENSURE ENLIVE)  237 mL Oral BID BM  . heparin  5,000 Units Subcutaneous 3 times per day  . LORazepam  0.5 mg Oral QHS  . pantoprazole  40 mg Oral Daily  . piperacillin-tazobactam (ZOSYN)  IV  3.375 g  Intravenous Q8H  . simvastatin  20 mg Oral Daily  . sodium chloride  3 mL Intravenous Q12H   Continuous Infusions:   Time spent on care of this patient: 35 min   Yareni Creps, MD 11/12/2015, 2:15 PM  LOS: 1 day   [facility] Office  [phone] Pager - Text Page per [email or web address] If 7PM-7AM, please contact night-coverage [email or web address]

## 2015-11-12 NOTE — Progress Notes (Signed)
Patient ID: [name], female   DOB: [birth year], 75 y.o.   MRN: [id number]  General Surgery - [facility]  HD#: 2  Subjective: Patient states pain relieved overnight.  States has had bleeding per rectum chronically.  Objective: Vital signs in last 24 hours: Temp:  [98.5 F (36.9 C)-98.7 F (37.1 C)] 98.5 F (36.9 C) (01/14 0512) Pulse Rate:  [57-105] 57 (01/14 0512) Resp:  [13-21] 19 (01/14 0512) BP: (114-197)/(54-96) 164/54 mmHg (01/14 0512) SpO2:  [95 %-100 %] 100 % (01/14 0512) Weight:  [58 kg (127 lb 13.9 oz)] 58 kg (127 lb 13.9 oz) (01/13 1938) Last BM Date: 11/11/15  Intake/Output from previous day: 01/13 0701 - 01/14 0700 In: 100 [IV Piggyback:100] Out: 0  Intake/Output this shift:    Physical Exam: HEENT - sclerae clear, mucous membranes moist Neck - soft Abdomen - soft, non-distended; no tenderness RLQ; no mass; no guarding Ext - no edema, non-tender Neuro - alert & oriented, no focal deficits  Lab Results:   Recent Labs  11/11/15 0130 11/11/15 1936  WBC 9.3 12.1*  HGB 14.2 13.3  HCT 41.7 39.1  PLT 159 147*   BMET  Recent Labs  11/11/15 0130 11/11/15 1936  NA 143  --   K 4.1  --   CL 107  --   CO2 23  --   GLUCOSE 77  --   BUN 41*  --   CREATININE 2.01* 1.59*  CALCIUM 10.0  --    PT/INR No results for input(s): LABPROT, INR in the last 72 hours. Comprehensive Metabolic Panel:    Component Value Date/Time   NA 143 11/11/2015 0130   NA 143 06/17/2015 1353   K 4.1 11/11/2015 0130   K 3.7 06/17/2015 1353   CL 107 11/11/2015 0130   CL 111 06/17/2015 1353   CO2 23 11/11/2015 0130   CO2 24 06/17/2015 1353   BUN 41* 11/11/2015 0130   BUN 11 06/17/2015 1353   CREATININE 1.59* 11/11/2015 1936   CREATININE 2.01* 11/11/2015 0130   GLUCOSE 77 11/11/2015 0130   GLUCOSE 95 06/17/2015 1353   CALCIUM 10.0 11/11/2015 0130   CALCIUM 9.1 06/17/2015 1353   AST 38 11/11/2015 0130   AST 19 06/17/2015 1353   ALT 19  11/11/2015 0130   ALT 11* 06/17/2015 1353   ALKPHOS 59 11/11/2015 0130   ALKPHOS 54 06/17/2015 1353   BILITOT 0.9 11/11/2015 0130   BILITOT 0.6 06/17/2015 1353   PROT 7.0 11/11/2015 0130   PROT 5.9* 06/17/2015 1353   ALBUMIN 4.2 11/11/2015 0130   ALBUMIN 3.4* 06/17/2015 1353    Studies/Results: Ct Abdomen Pelvis Wo Contrast  11/11/2015  CLINICAL DATA:  pt has had N/D, RUQ pain and lack of appetite for 1 week. EXAM: CT ABDOMEN AND PELVIS WITHOUT CONTRAST TECHNIQUE: Multidetector CT imaging of the abdomen and pelvis was performed following the standard protocol without IV contrast. COMPARISON:  05/14/2013, 11/11/2015 and prior studies including 05/03/2009 FINDINGS: Lower chest: Small left pleural effusion is associated with atelectasis at the left lung base. Coronary artery calcification is present. Upper abdomen: Small low-attenuation lesions are identified within the liver, largest measuring approximately 10 mm. No focal abnormality identified within the spleen, pancreas. The gallbladder is present and is distended. Lesions appear stable compared with previous contrast-enhanced exam and are consistent with small cysts. Stable 3.1 cm left renal cyst. A right parapelvic cyst is now smaller, measuring 1.7 cm. No ureteral stones. Bilateral adrenal hyperplasia noted, stable.  Within the left upper quadrant there is a small solid nodule just below the greater curvature of the stomach measuring 1.8 x 1.0 cm. Nodule is is discrete from the stomach, pancreatic tail, and colon and is slightly different attenuation from the spleen making splenule less likely. Nodule is stable over multiple prior studies and favored to be benign. Gastrointestinal tract: The stomach and small bowel loops are normal in appearance. The appendix is mildly thickened. There is periappendiceal stranding. Appendix measures 8 mm. Colonic loops are normal in caliber. There are numerous colonic diverticula but no acute diverticulitis  identified. There is mild thickening of the peritoneal reflection adjacent to the descending colon and this appears stable. Findings may indicate chronic diverticulitis. Pelvis: The uterus is present. No adnexal mass or free pelvic fluid. Retroperitoneum: There is marked atherosclerotic cysts of the abdominal aorta. No aneurysm. No retroperitoneal or mesenteric adenopathy. Abdominal wall: Unremarkable. Osseous structures: There are degenerative changes in the lumbar spine, particularly involving L4-5. At this level there is 2 mm anterolisthesis and vacuum disc phenomenon. Degenerative disc disease also noted at L2-3. IMPRESSION: 1. Mild appendiceal thickening and mild periappendiceal inflammatory change. 2. No associated abscess or free intraperitoneal air. 3. Colonic diverticulosis. 4. Bilateral adrenal hyperplasia. 5. Indeterminate left upper quadrant solid nodule, favored to be benign given stability. Electronically Signed   By: Norva PavlovElizabeth  Brown M.D.   On: 11/11/2015 10:30   Dg Abd Acute W/chest  11/11/2015  CLINICAL DATA:  Initial valuation for acute vomiting. EXAM: DG ABDOMEN ACUTE W/ 1V CHEST COMPARISON:  Prior study from 06/11/2015. FINDINGS: Mild cardiomegaly is stable. Atheromatous plaque within the aortic arch. Mediastinal silhouette within normal limits. Lungs are normally inflated. Minimal atelectatic changes at the left lung base. No focal infiltrate. No pulmonary edema or pleural effusion. No pneumothorax. Bowel gas pattern within normal limits without evidence of obstruction or ileus. No abnormal bowel wall thickening. No free air. No soft tissue mass or abnormal calcification. Scoliosis with multilevel degenerative changes within the lower lumbar spine. No acute osseus abnormality. IMPRESSION: 1. Nonobstructive bowel gas pattern with no radiographic evidence for acute intra-abdominal process. 2. Mild left basilar atelectasis. No other active cardiopulmonary disease. Electronically Signed   By:  Rise MuBenjamin  McClintock M.D.   On: 11/11/2015 04:04    Anti-infectives: Anti-infectives    Start     Dose/Rate Route Frequency Ordered Stop   11/11/15 2000  piperacillin-tazobactam (ZOSYN) IVPB 3.375 g     3.375 g 12.5 mL/hr over 240 Minutes Intravenous Every 8 hours 11/11/15 1258     11/11/15 1300  piperacillin-tazobactam (ZOSYN) IVPB 3.375 g     3.375 g 100 mL/hr over 30 Minutes Intravenous  Once 11/11/15 1258 11/11/15 1647      Assessment & Plans: RLQ Abdominal Pain  Pain much improved overnight  Would begin clear liquid diet this AM - ordered  Continue serial exams  Encourage OOB, ambulation  Check labs - Hgb, WBC  May need GI consult as out-patient for follow up colonoscopy  Will follow - no role for surgical intervention at present Mild appendiceal thickening and mild periappendiceal inflammatory as seen on CT Cecal diverticula Colonic diverticulosis on CT  Velora Hecklerodd M. Louanna Vanliew, MD, [facility] Office: [phone]   Sulaiman Imbert Judie PetitM 11/12/2015

## 2015-11-12 NOTE — Progress Notes (Signed)
Initial Nutrition Assessment  DOCUMENTATION CODES:   Severe malnutrition in context of acute illness/injury  INTERVENTION:  Provide Ensure Enlive po BID, each supplement provides 350 kcal and 20 grams of protein.  Encourage adequate PO intake.   NUTRITION DIAGNOSIS:   Malnutrition related to acute illness as evidenced by energy intake < or equal to 50% for > or equal to 5 days, moderate depletions of muscle mass, moderate depletion of body fat.  GOAL:   Patient will meet greater than or equal to 90% of their needs  MONITOR:   PO intake, Supplement acceptance, Diet advancement, Weight trends, Labs, I & O's  REASON FOR ASSESSMENT:   Malnutrition Screening Tool    ASSESSMENT:   75 y.o. female with a history of htn, CAD and kidney disease Who presented to the Emergency department because of lower abdominal pain, vomiting and diarrhea. Pt reports she has had diarrhea and nausea past week. She has had abdominal pain for 2 years on and off. Pt has been taking pepcid for symptoms with no relief. Pt denies any black,tarry or bloody diarrhea.  Pt has been advanced to a full liquid diet at lunch time today. Pt reports she was able to tolerate her clear liquid tray this AM with no other difficulties. PT reports having a lack of appetite for 1 week PTA with poor po intake. She reports only having a few bites of her food at meals. Pt does report consuming an Ensure at least once daily. RD to order Ensure while pt admitted to aid in caloric and protein needs. Usual body weight reported to be ~133 lbs. Pt with a 4.5% weight loss..   Nutrition-Focused physical exam completed. Findings are moderate fat depletion, moderate muscle depletion, and no edema.   Labs and medications reviewed.   Diet Order:  Diet full liquid Room service appropriate?: Yes; Fluid consistency:: Thin  Skin:  Reviewed, no issues  Last BM:  1/13  Height:   Ht Readings from Last 1 Encounters:  11/11/15 5\' 7"   (1.702 m)    Weight:   Wt Readings from Last 1 Encounters:  11/11/15 127 lb 13.9 oz (58 kg)    Ideal Body Weight:  61.36 kg  BMI:  Body mass index is 20.02 kg/(m^2).  Estimated Nutritional Needs:   Kcal:  1600-1850  Protein:  70-80 grams  Fluid:  1.6 - 1.8 L/day  EDUCATION NEEDS:   No education needs identified at this time  Roslyn SmilingStephanie Montserrath Madding, MS, RD, LDN Pager # [phone] After hours/ weekend pager # [phone]

## 2015-11-13 DIAGNOSIS — I10 Essential (primary) hypertension: Secondary | ICD-10-CM

## 2015-11-13 MED ORDER — PIPERACILLIN-TAZOBACTAM 3.375 G IVPB
3.3750 g | Freq: Three times a day (TID) | INTRAVENOUS | Status: DC
  Filled 2015-11-13 (×2): qty 50

## 2015-11-13 MED ORDER — AMOXICILLIN-POT CLAVULANATE 500-125 MG PO TABS
1.0000 | ORAL_TABLET | Freq: Three times a day (TID) | ORAL | Status: AC
Start: 2015-11-13 — End: ?

## 2015-11-13 MED ORDER — PIPERACILLIN-TAZOBACTAM 3.375 G IVPB 30 MIN
3.3750 g | Freq: Once | INTRAVENOUS | Status: AC
  Administered 2015-11-13: 3.375 g via INTRAVENOUS
  Filled 2015-11-13: qty 50

## 2015-11-13 NOTE — Discharge Summary (Signed)
Physician Discharge Summary  [name]:[id number] DOB: 1941-05-07 DOA: 11/11/2015  PCP: Lorenda Peck, MD  Admit date: 11/11/2015 Discharge date: 11/13/2015  Time spent: 50 minutes  Recommendations for Outpatient Follow-up:  1. Follow-up Bmet to ensure renal function remained stable and CBC to check on thrombocytopenia in 1 week  Discharge Condition: Stable  Discharge Diagnoses:  Principal Problem:   Abdominal pain/vomiting/diarrhea Active Problems:   Acute renal failure (ARF) (HCC)   CKD (chronic kidney disease) stage 3, GFR 30-59 ml/min   HTN (hypertension)   Protein-calorie malnutrition, severe  I'll thrombocytopenia  History of present illness:  [name] is a 75 y.o. female with HTN, CAD, CKD 3 who presents with almost 1 wk of nausea, vomiting and diarrhea. CT in ER suggests possibly appendicitis. She was started on Zosyn and admitted. Surgery consult requested.   Hospital Course:  Principal Problem:  Abdominal pain/ vomiting/ diarrhea -Possible viral versus bacterial gastroenteritis - symptoms improved rather quickly soon after she was admitted to the hospital-she was started on clear liquids yesterday morning and progressed to solid diet by evening-no recurrence of vomiting abdominal pain or diarrhea-will transition from Zosyn to Augmentin and complete a 7 day course and K she had an underlying bacterial infection or mild appendicitis-surgery team has been following along and does not feel that she meets requirements for surgery  Active Problems:  AKI on CKD (chronic kidney disease) stage 3, GFR 30-59 ml/min - Cr improving to baseline  Mild thrombocytopenia -Most likely secondary to underlying infection-would recommend repeating a CBC in 2-3 days-have discussed this with the patient and have noted on her discharge papers which she is to take to her PCP this coming Tuesday   HTN (hypertension) - cont Atenolol and Imdur - Temporarily held  Losartan due to ARf- she can resume it tomorrow   Protein-calorie malnutrition, severe - Was given Ensure  during the hospital stay-she is now eating quite well and finishing all of her meals   Consultations:  General surgery  Discharge Exam: Filed Weights   11/11/15 1938 11/12/15 2100  Weight: 58 kg (127 lb 13.9 oz) 58 kg (127 lb 13.9 oz)   Filed Vitals:   11/13/15 0436 11/13/15 0806  BP: 96/50 136/75  Pulse: 61 56  Temp: 98.9 F (37.2 C) 98.6 F (37 C)  Resp: 20 16    General: AAO x 3, no distress Cardiovascular: RRR, no murmurs  Respiratory: clear to auscultation bilaterally GI: soft, non-tender, non-distended, bowel sound positive  Discharge Instructions You were cared for by a hospitalist during your hospital stay. If you have any questions about your discharge medications or the care you received while you were in the hospital after you are discharged, you can call the unit and asked to speak with the hospitalist on call if the hospitalist that took care of you is not available. Once you are discharged, your primary care physician will handle any further medical issues. Please note that NO REFILLS for any discharge medications will be authorized once you are discharged, as it is imperative that you return to your primary care physician (or establish a relationship with a primary care physician if you do not have one) for your aftercare needs so that they can reassess your need for medications and monitor your lab values.      Discharge Instructions    Diet - low sodium heart healthy    Complete by:  As directed      Discharge instructions  Complete by:  As directed   Bmet and CBC in 3 days Continue soft diet and complete antibiotics.     Increase activity slowly    Complete by:  As directed             Medication List    TAKE these medications        amoxicillin-clavulanate 500-125 MG tablet  Commonly known as:  AUGMENTIN  Take 1 tablet (500 mg total) by  mouth 3 (three) times daily.     aspirin 81 MG tablet  Take 81 mg by mouth daily.     atenolol 100 MG tablet  Commonly known as:  TENORMIN  Take 100 mg by mouth daily.     HYDROcodone-acetaminophen 5-325 MG tablet  Commonly known as:  NORCO/VICODIN  Take 1 tablet by mouth every 6 (six) hours as needed. For lower back pain     isosorbide mononitrate 60 MG 24 hr tablet  Commonly known as:  IMDUR  Take 1 tablet (60 mg total) by mouth daily.     LORazepam 0.5 MG tablet  Commonly known as:  ATIVAN  Take 0.5 mg by mouth at bedtime.     losartan 100 MG tablet  Commonly known as:  COZAAR  Take 100 mg by mouth daily.     nitroGLYCERIN 0.4 MG/SPRAY spray  Commonly known as:  NITROLINGUAL  Place 1 spray under the tongue every 5 (five) minutes as needed for chest pain.     omeprazole 20 MG capsule  Commonly known as:  PRILOSEC  Take 20 mg by mouth daily.     promethazine 6.25 MG/5ML syrup  Commonly known as:  PHENERGAN  Take 5 mLs by mouth as needed.     simvastatin 20 MG tablet  Commonly known as:  ZOCOR  Take 20 mg by mouth daily.     triamcinolone cream 0.1 %  Commonly known as:  KENALOG  Apply 1 application topically 2 (two) times daily.     VITAMIN D-3 PO  Take 1 tablet by mouth daily.       Allergies  Allergen Reactions  . Paxil [Paroxetine Hcl] Other (See Comments)    unknown      The results of significant diagnostics from this hospitalization (including imaging, microbiology, ancillary and laboratory) are listed below for reference.    Significant Diagnostic Studies: Ct Abdomen Pelvis Wo Contrast  11/11/2015  CLINICAL DATA:  pt has had N/D, RUQ pain and lack of appetite for 1 week. EXAM: CT ABDOMEN AND PELVIS WITHOUT CONTRAST TECHNIQUE: Multidetector CT imaging of the abdomen and pelvis was performed following the standard protocol without IV contrast. COMPARISON:  05/14/2013, 11/11/2015 and prior studies including 05/03/2009 FINDINGS: Lower chest: Small  left pleural effusion is associated with atelectasis at the left lung base. Coronary artery calcification is present. Upper abdomen: Small low-attenuation lesions are identified within the liver, largest measuring approximately 10 mm. No focal abnormality identified within the spleen, pancreas. The gallbladder is present and is distended. Lesions appear stable compared with previous contrast-enhanced exam and are consistent with small cysts. Stable 3.1 cm left renal cyst. A right parapelvic cyst is now smaller, measuring 1.7 cm. No ureteral stones. Bilateral adrenal hyperplasia noted, stable. Within the left upper quadrant there is a small solid nodule just below the greater curvature of the stomach measuring 1.8 x 1.0 cm. Nodule is is discrete from the stomach, pancreatic tail, and colon and is slightly different attenuation from the spleen making splenule less likely. Nodule  is stable over multiple prior studies and favored to be benign. Gastrointestinal tract: The stomach and small bowel loops are normal in appearance. The appendix is mildly thickened. There is periappendiceal stranding. Appendix measures 8 mm. Colonic loops are normal in caliber. There are numerous colonic diverticula but no acute diverticulitis identified. There is mild thickening of the peritoneal reflection adjacent to the descending colon and this appears stable. Findings may indicate chronic diverticulitis. Pelvis: The uterus is present. No adnexal mass or free pelvic fluid. Retroperitoneum: There is marked atherosclerotic cysts of the abdominal aorta. No aneurysm. No retroperitoneal or mesenteric adenopathy. Abdominal wall: Unremarkable. Osseous structures: There are degenerative changes in the lumbar spine, particularly involving L4-5. At this level there is 2 mm anterolisthesis and vacuum disc phenomenon. Degenerative disc disease also noted at L2-3. IMPRESSION: 1. Mild appendiceal thickening and mild periappendiceal inflammatory change.  2. No associated abscess or free intraperitoneal air. 3. Colonic diverticulosis. 4. Bilateral adrenal hyperplasia. 5. Indeterminate left upper quadrant solid nodule, favored to be benign given stability. Electronically Signed   By: Norva PavlovElizabeth  Brown M.D.   On: 11/11/2015 10:30   Dg Abd Acute W/chest  11/11/2015  CLINICAL DATA:  Initial valuation for acute vomiting. EXAM: DG ABDOMEN ACUTE W/ 1V CHEST COMPARISON:  Prior study from 06/11/2015. FINDINGS: Mild cardiomegaly is stable. Atheromatous plaque within the aortic arch. Mediastinal silhouette within normal limits. Lungs are normally inflated. Minimal atelectatic changes at the left lung base. No focal infiltrate. No pulmonary edema or pleural effusion. No pneumothorax. Bowel gas pattern within normal limits without evidence of obstruction or ileus. No abnormal bowel wall thickening. No free air. No soft tissue mass or abnormal calcification. Scoliosis with multilevel degenerative changes within the lower lumbar spine. No acute osseus abnormality. IMPRESSION: 1. Nonobstructive bowel gas pattern with no radiographic evidence for acute intra-abdominal process. 2. Mild left basilar atelectasis. No other active cardiopulmonary disease. Electronically Signed   By: Rise MuBenjamin  McClintock M.D.   On: 11/11/2015 04:04    Microbiology: No results found for this or any previous visit (from the past 240 hour(s)).   Labs: Basic Metabolic Panel:  Recent Labs Lab 11/11/15 0130 11/11/15 1936 11/12/15 0506  NA 143  --  141  K 4.1  --  4.1  CL 107  --  113*  CO2 23  --  20*  GLUCOSE 77  --  56*  BUN 41*  --  33*  CREATININE 2.01* 1.59* 1.53*  CALCIUM 10.0  --  8.6*   Liver Function Tests:  Recent Labs Lab 11/11/15 0130 11/12/15 0506  AST 38 23  ALT 19 12*  ALKPHOS 59 45  BILITOT 0.9 0.6  PROT 7.0 5.3*  ALBUMIN 4.2 3.1*    Recent Labs Lab 11/11/15 0130  LIPASE 19   No results for input(s): AMMONIA in the last 168 hours. CBC:  Recent  Labs Lab 11/11/15 0130 11/11/15 1936 11/12/15 0506  WBC 9.3 12.1* 10.5  HGB 14.2 13.3 11.8*  HCT 41.7 39.1 36.4  MCV 84.2 85.6 86.1  PLT 159 147* 132*   Cardiac Enzymes: No results for input(s): CKTOTAL, CKMB, CKMBINDEX, TROPONINI in the last 168 hours. BNP: BNP (last 3 results) No results for input(s): BNP in the last 8760 hours.  ProBNP (last 3 results) No results for input(s): PROBNP in the last 8760 hours.  CBG:  Recent Labs Lab 11/11/15 0133  GLUCAP 70       Signed:  Calvert CantorIZWAN,Adiana Smelcer, MD [facility] 11/13/2015, 10:21 AM

## 2015-11-13 NOTE — Care Management Important Message (Signed)
Important Message  Patient Details  Name: Nyoka CowdenCarolyn L Capaldi MRN: [id number] Date of Birth: [birth year]   Medicare Important Message Given:  Yes    Michel BickersROGERS, Nataleah Scioneaux, RN 11/13/2015, 12:38 PM

## 2015-11-13 NOTE — Progress Notes (Signed)
Discharge instructions and medications discussed with patient.  All questions answered.  

## 2015-11-13 NOTE — Progress Notes (Signed)
Patient ID: [name], female   DOB: 30-Nov-1940, 75 y.o.   MRN: [id number]  [facility]  HD#: 3  Subjective: Patient up in room, no complaints.  Apparently took regular diet last PM without complaints.  Diarrhea resolved.  Objective: Vital signs in last 24 hours: Temp:  [98.7 F (37.1 C)-98.9 F (37.2 C)] 98.9 F (37.2 C) (01/15 0436) Pulse Rate:  [55-95] 61 (01/15 0436) Resp:  [18-20] 20 (01/15 0436) BP: (96-143)/(50-75) 96/50 mmHg (01/15 0436) SpO2:  [98 %-100 %] 98 % (01/15 0436) Weight:  [58 kg (127 lb 13.9 oz)] 58 kg (127 lb 13.9 oz) (01/14 2100) Last BM Date: 11/12/15  Intake/Output from previous day: 01/14 0701 - 01/15 0700 In: 1450 [P.O.:1400; IV Piggyback:50] Out: 0  Intake/Output this shift:    Physical Exam: HEENT - sclerae clear, mucous membranes moist Neck - soft Abdomen - soft without distension; no tenderness; no mass; no guarding Ext - no edema, non-tender Neuro - alert & oriented, no focal deficits  Lab Results:   Recent Labs  11/11/15 1936 11/12/15 0506  WBC 12.1* 10.5  HGB 13.3 11.8*  HCT 39.1 36.4  PLT 147* 132*   BMET  Recent Labs  11/11/15 0130 11/11/15 1936 11/12/15 0506  NA 143  --  141  K 4.1  --  4.1  CL 107  --  113*  CO2 23  --  20*  GLUCOSE 77  --  56*  BUN 41*  --  33*  CREATININE 2.01* 1.59* 1.53*  CALCIUM 10.0  --  8.6*   PT/INR No results for input(s): LABPROT, INR in the last 72 hours. Comprehensive Metabolic Panel:    Component Value Date/Time   NA 141 11/12/2015 0506   NA 143 11/11/2015 0130   K 4.1 11/12/2015 0506   K 4.1 11/11/2015 0130   CL 113* 11/12/2015 0506   CL 107 11/11/2015 0130   CO2 20* 11/12/2015 0506   CO2 23 11/11/2015 0130   BUN 33* 11/12/2015 0506   BUN 41* 11/11/2015 0130   CREATININE 1.53* 11/12/2015 0506   CREATININE 1.59* 11/11/2015 1936   GLUCOSE 56* 11/12/2015 0506   GLUCOSE 77 11/11/2015 0130   CALCIUM 8.6* 11/12/2015 0506   CALCIUM  10.0 11/11/2015 0130   AST 23 11/12/2015 0506   AST 38 11/11/2015 0130   ALT 12* 11/12/2015 0506   ALT 19 11/11/2015 0130   ALKPHOS 45 11/12/2015 0506   ALKPHOS 59 11/11/2015 0130   BILITOT 0.6 11/12/2015 0506   BILITOT 0.9 11/11/2015 0130   PROT 5.3* 11/12/2015 0506   PROT 7.0 11/11/2015 0130   ALBUMIN 3.1* 11/12/2015 0506   ALBUMIN 4.2 11/11/2015 0130    Studies/Results: Ct Abdomen Pelvis Wo Contrast  11/11/2015  CLINICAL DATA:  pt has had N/D, RUQ pain and lack of appetite for 1 week. EXAM: CT ABDOMEN AND PELVIS WITHOUT CONTRAST TECHNIQUE: Multidetector CT imaging of the abdomen and pelvis was performed following the standard protocol without IV contrast. COMPARISON:  05/14/2013, 11/11/2015 and prior studies including 05/03/2009 FINDINGS: Lower chest: Small left pleural effusion is associated with atelectasis at the left lung base. Coronary artery calcification is present. Upper abdomen: Small low-attenuation lesions are identified within the liver, largest measuring approximately 10 mm. No focal abnormality identified within the spleen, pancreas. The gallbladder is present and is distended. Lesions appear stable compared with previous contrast-enhanced exam and are consistent with small cysts. Stable 3.1 cm left renal cyst. A right  parapelvic cyst is now smaller, measuring 1.7 cm. No ureteral stones. Bilateral adrenal hyperplasia noted, stable. Within the left upper quadrant there is a small solid nodule just below the greater curvature of the stomach measuring 1.8 x 1.0 cm. Nodule is is discrete from the stomach, pancreatic tail, and colon and is slightly different attenuation from the spleen making splenule less likely. Nodule is stable over multiple prior studies and favored to be benign. Gastrointestinal tract: The stomach and small bowel loops are normal in appearance. The appendix is mildly thickened. There is periappendiceal stranding. Appendix measures 8 mm. Colonic loops are normal in  caliber. There are numerous colonic diverticula but no acute diverticulitis identified. There is mild thickening of the peritoneal reflection adjacent to the descending colon and this appears stable. Findings may indicate chronic diverticulitis. Pelvis: The uterus is present. No adnexal mass or free pelvic fluid. Retroperitoneum: There is marked atherosclerotic cysts of the abdominal aorta. No aneurysm. No retroperitoneal or mesenteric adenopathy. Abdominal wall: Unremarkable. Osseous structures: There are degenerative changes in the lumbar spine, particularly involving L4-5. At this level there is 2 mm anterolisthesis and vacuum disc phenomenon. Degenerative disc disease also noted at L2-3. IMPRESSION: 1. Mild appendiceal thickening and mild periappendiceal inflammatory change. 2. No associated abscess or free intraperitoneal air. 3. Colonic diverticulosis. 4. Bilateral adrenal hyperplasia. 5. Indeterminate left upper quadrant solid nodule, favored to be benign given stability. Electronically Signed   By: Norva Pavlov M.D.   On: 11/11/2015 10:30    Anti-infectives: Anti-infectives    Start     Dose/Rate Route Frequency Ordered Stop   11/13/15 0000  piperacillin-tazobactam (ZOSYN) IVPB 3.375 g     3.375 g 12.5 mL/hr over 240 Minutes Intravenous Every 8 hours 11/12/15 1731     11/11/15 2000  piperacillin-tazobactam (ZOSYN) IVPB 3.375 g  Status:  Discontinued     3.375 g 12.5 mL/hr over 240 Minutes Intravenous Every 8 hours 11/11/15 1258 11/12/15 1731   11/11/15 1300  piperacillin-tazobactam (ZOSYN) IVPB 3.375 g     3.375 g 100 mL/hr over 30 Minutes Intravenous  Once 11/11/15 1258 11/11/15 1647      Assessment & Plans: RLQ Abdominal Pain Pain resolved Advanced to regular diet per patient and tolerated WBC now normal May need GI consult as out-patient for follow up colonoscopy May be discharged home on oral abx for 7-10 days per  medical service  Will arrange follow up at [facility] office in 2-3 weeks Mild appendiceal thickening and mild periappendiceal inflammatory as seen on CT Cecal diverticula Colonic diverticulosis on CT  Velora Heckler, MD, [facility] Office: [phone]   [name] Judie Petit 11/13/2015

## 2015-11-13 NOTE — Care Management (Signed)
CM met with patient to discuss transitional care plan. lives at home with friend, and denies any difficulty getting monthly medications.  Verbalized understanding of contacting PCP ASAP to arrange f/u appointment within 2 days, teach back done. No further discharge needs identified.  Will be transported home accompanied by friend via private vehicle.

## 2016-07-27 IMAGING — CT CT ABD-PELV W/O CM
2 of 4 series · 15 of 46 positions shown, 17 images · non-contrast
Comparison: 05/14/2013, 11/11/2015 and prior studies including
05/03/2009

CLINICAL DATA: pt has had N/D, RUQ pain and lack of appetite for 1
week.

EXAM:
CT ABDOMEN AND PELVIS WITHOUT CONTRAST
TECHNIQUE: Multidetector CT imaging of the abdomen and pelvis was performed
following the standard protocol without IV contrast.

[Series 2: a/p w/o 5mm · axial · non-contrast · 0.71mm/px · z∈[+710,+1130]mm · 12 of 92 slices shown, 14 images]
[im 4/92  soft-tissue]
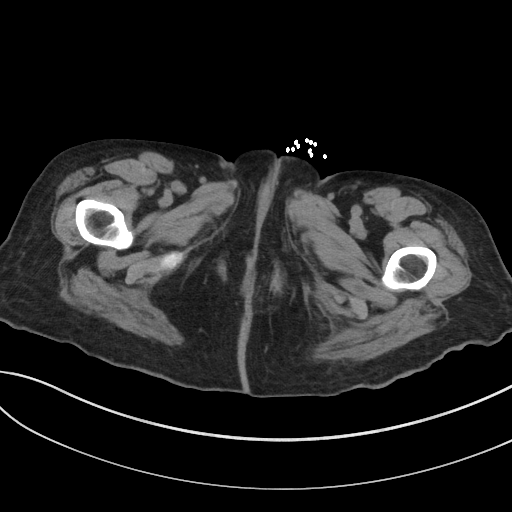
[im 4/92  bone]
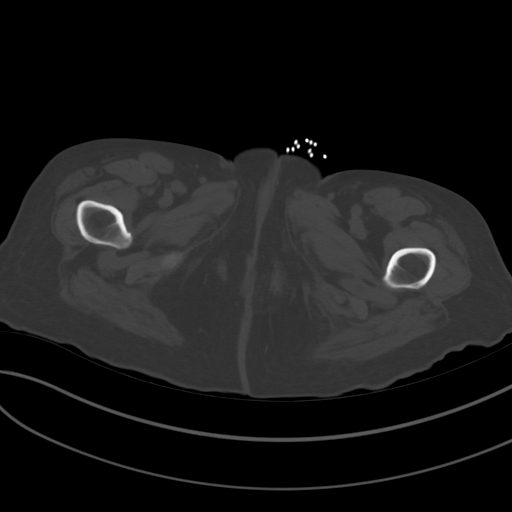
[im 12/92  soft-tissue]
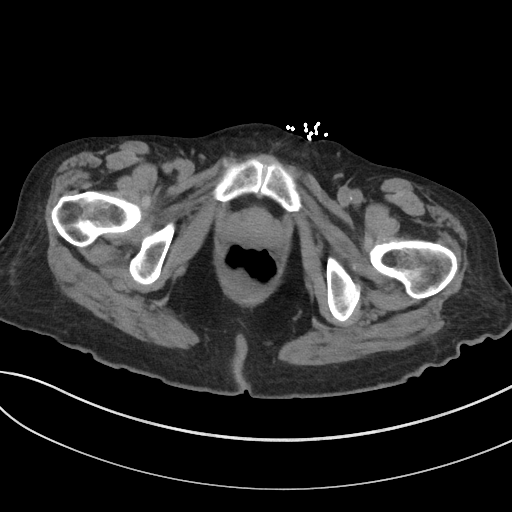
[im 19/92  soft-tissue]
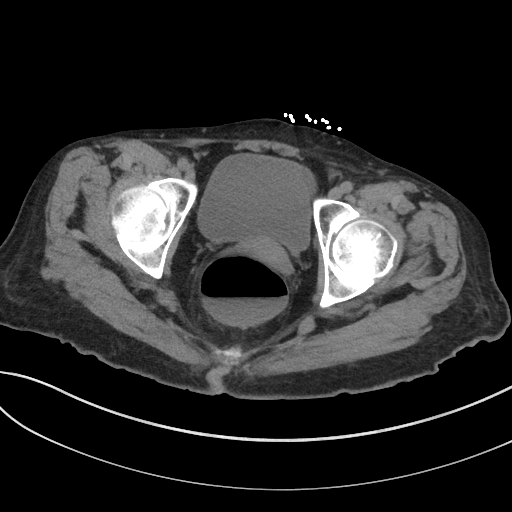
[im 27/92  soft-tissue]
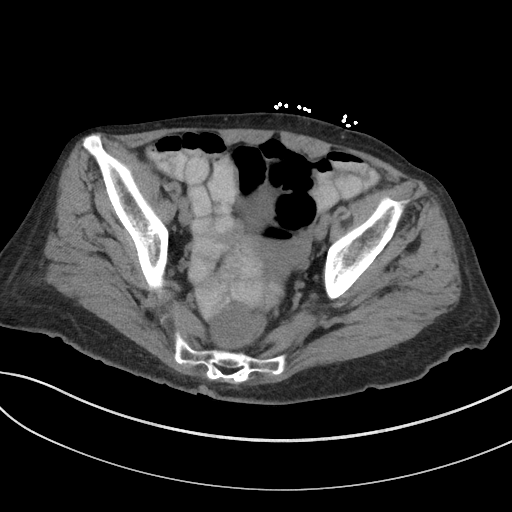
[im 35/92  soft-tissue]
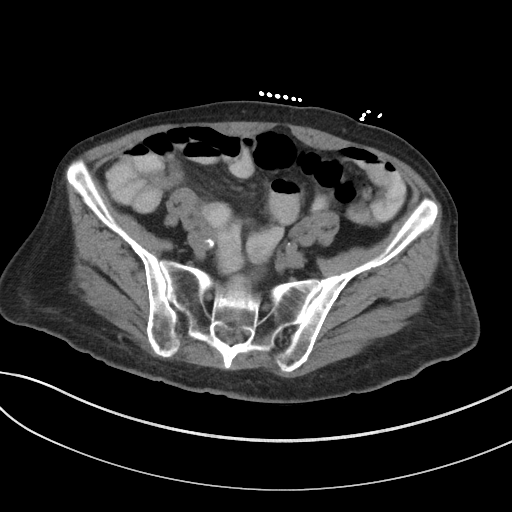
[im 42/92  soft-tissue]
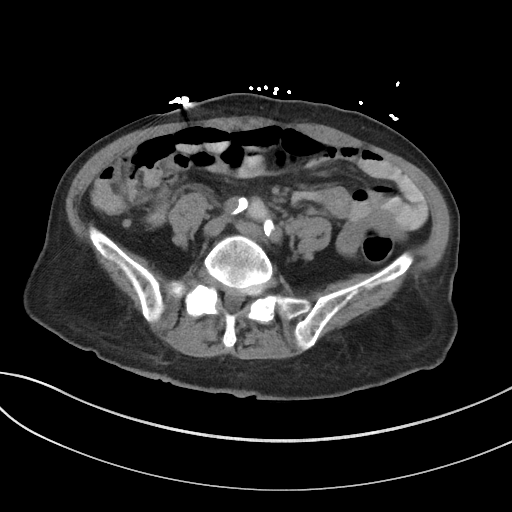
[im 50/92  soft-tissue]
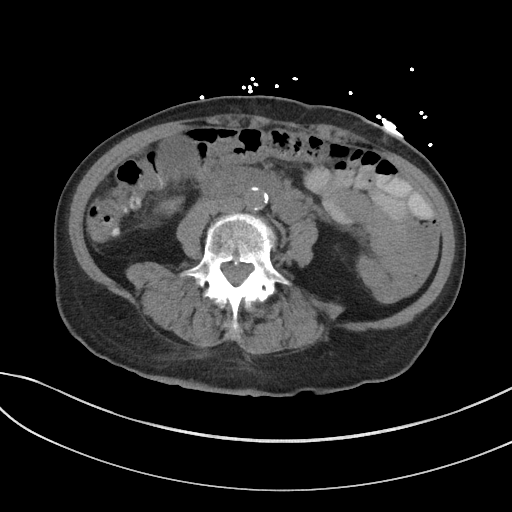
[im 57/92  soft-tissue]
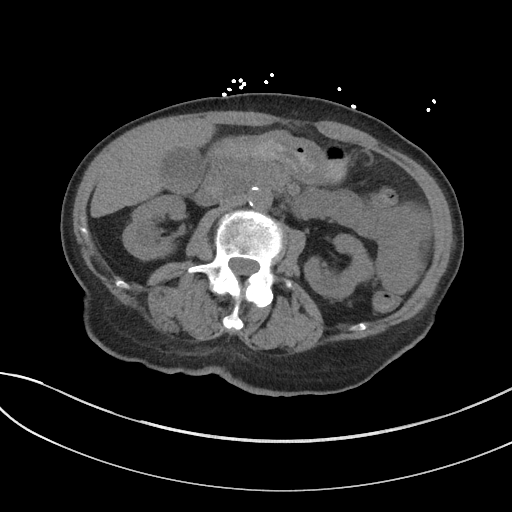
[im 65/92  soft-tissue]
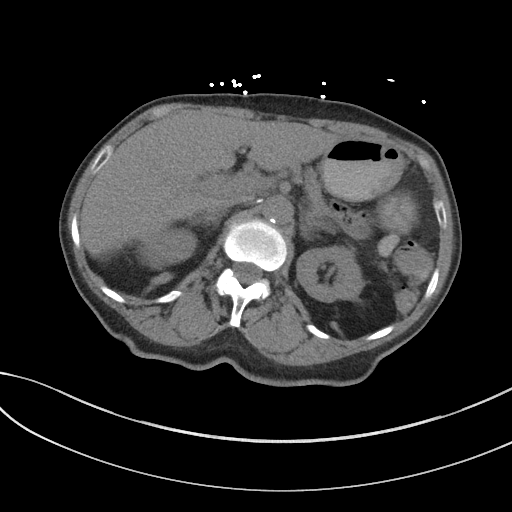
[im 65/92  bone]
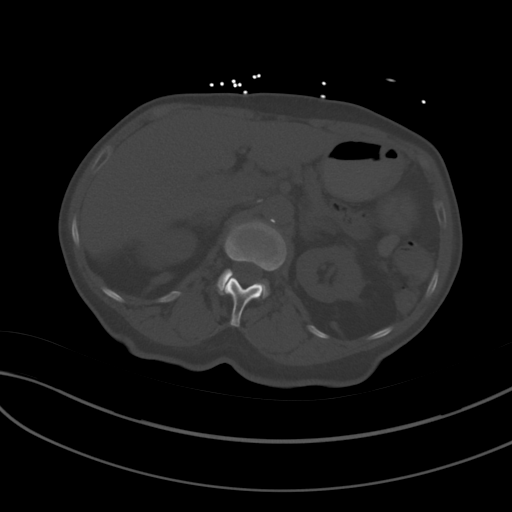
[im 73/92  soft-tissue]
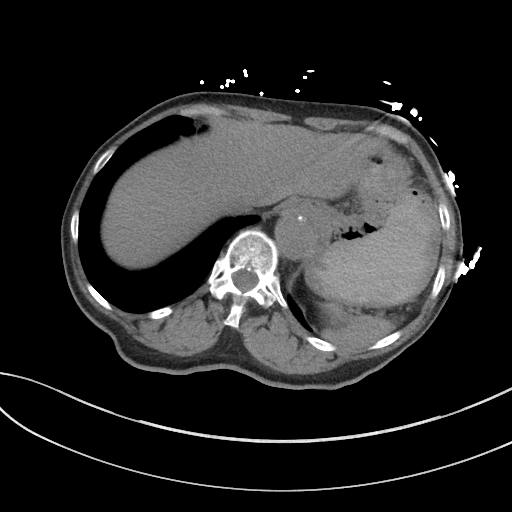
[im 80/92  soft-tissue]
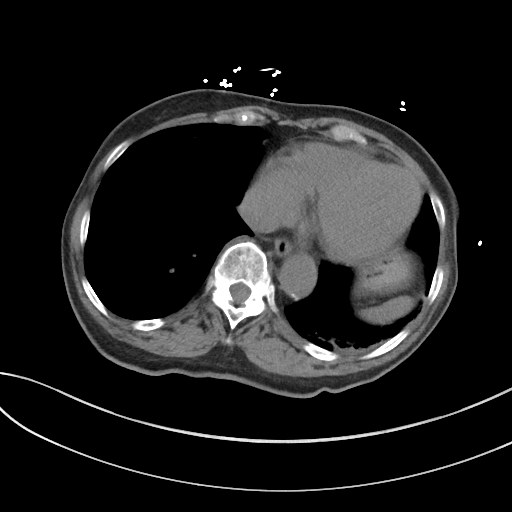
[im 88/92  soft-tissue]
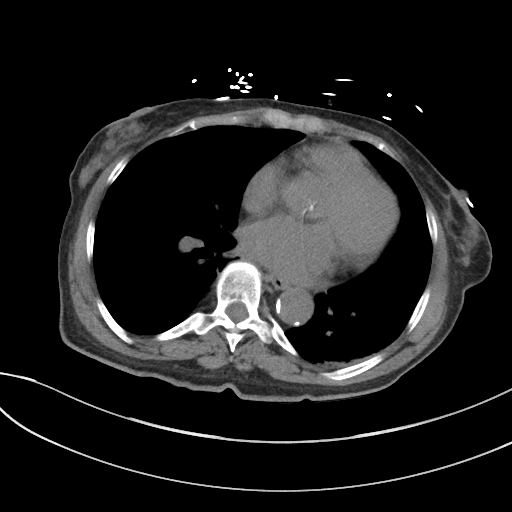

[Series 5: a/p w/o cor · coronal · non-contrast · 0.74mm/px · 3 of 122 slices shown]
[im 41/122  soft-tissue]
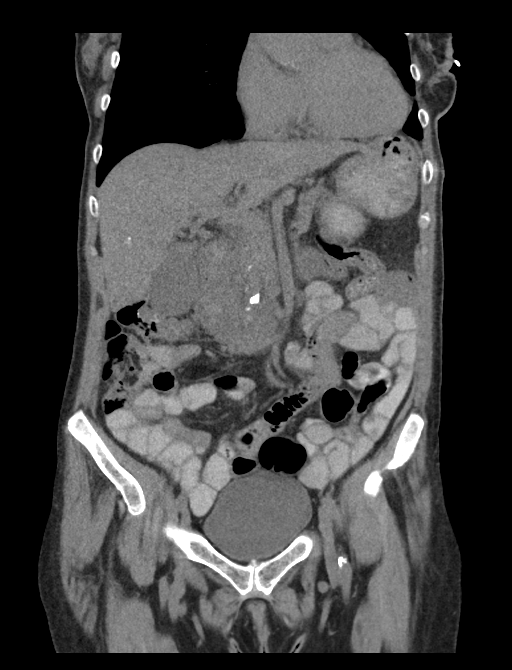
[im 54/122  soft-tissue]
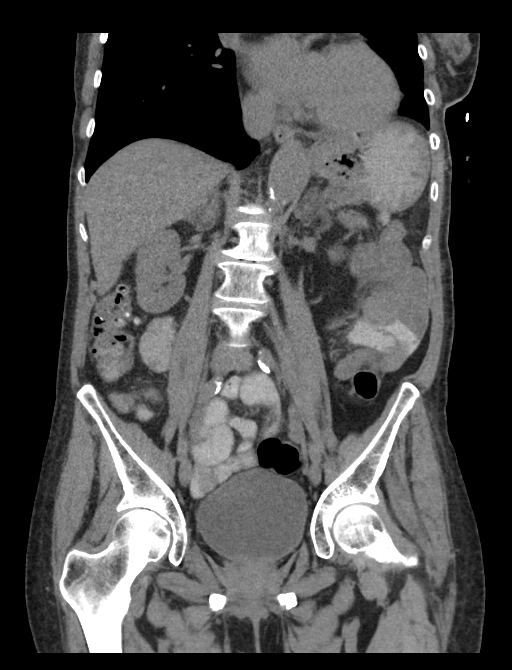
[im 68/122  soft-tissue]
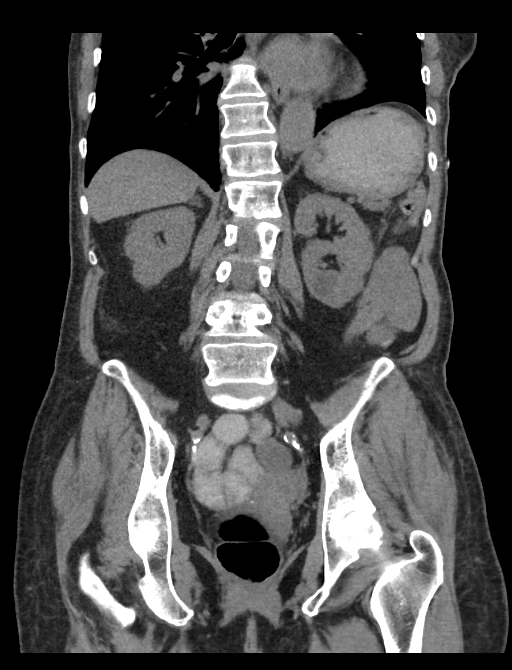

[15 of 46 positions shown; findings below may reference images not displayed]

FINDINGS: Lower chest: Small left pleural effusion is associated with
atelectasis at the left lung base. Coronary artery calcification is
present.

Upper abdomen: Small low-attenuation lesions are identified within
the liver, largest measuring approximately 10 mm. No focal
abnormality identified within the spleen, pancreas. The gallbladder
is present and is distended. Lesions appear stable compared with
previous contrast-enhanced exam and are consistent with small cysts.

Stable 3.1 cm left renal cyst. A right parapelvic cyst is now
smaller, measuring 1.7 cm.

No ureteral stones.

Bilateral adrenal hyperplasia noted, stable.

Within the left upper quadrant there is a small solid nodule just
below the greater curvature of the stomach measuring 1.8 x 1.0 cm.
Nodule is is discrete from the stomach, pancreatic tail, and colon
and is slightly different attenuation from the spleen making
splenule less likely. Nodule is stable over multiple prior studies
and favored to be benign.

Gastrointestinal tract: The stomach and small bowel loops are normal
in appearance. The appendix is mildly thickened. There is
periappendiceal stranding. Appendix measures 8 mm. Colonic loops are
normal in caliber. There are numerous colonic diverticula but no
acute diverticulitis identified. There is mild thickening of the
peritoneal reflection adjacent to the descending colon and this
appears stable. Findings may indicate chronic diverticulitis.

Pelvis: The uterus is present. No adnexal mass or free pelvic fluid.

Retroperitoneum: There is marked atherosclerotic cysts of the
abdominal aorta. No aneurysm. No retroperitoneal or mesenteric
adenopathy.

Abdominal wall: Unremarkable.

Osseous structures: There are degenerative changes in the lumbar
spine, particularly involving L4-5. At this level there is 2 mm
anterolisthesis and vacuum disc phenomenon. Degenerative disc
disease also noted at L2-3.
IMPRESSION: 1. Mild appendiceal thickening and mild periappendiceal inflammatory
change.
2. No associated abscess or free intraperitoneal air.
3. Colonic diverticulosis.
4. Bilateral adrenal hyperplasia.
5. Indeterminate left upper quadrant solid nodule, favored to be
benign given stability.

## 2019-10-30 DEATH — deceased
